# Patient Record
Sex: Female | Born: 1945 | Race: Asian | Hispanic: No | Marital: Married | Smoking: Never smoker
Health system: Southern US, Community
[De-identification: ages and names within clinical notes are randomized; demographics above are authoritative.]

## PROBLEM LIST (undated history)

## (undated) DIAGNOSIS — I10 Essential (primary) hypertension: Secondary | ICD-10-CM

## (undated) DIAGNOSIS — H269 Unspecified cataract: Secondary | ICD-10-CM

## (undated) DIAGNOSIS — M542 Cervicalgia: Secondary | ICD-10-CM

## (undated) DIAGNOSIS — E785 Hyperlipidemia, unspecified: Secondary | ICD-10-CM

## (undated) DIAGNOSIS — K029 Dental caries, unspecified: Secondary | ICD-10-CM

## (undated) DIAGNOSIS — E042 Nontoxic multinodular goiter: Secondary | ICD-10-CM

## (undated) HISTORY — DX: Unspecified cataract: H26.9

## (undated) HISTORY — DX: Dental caries, unspecified: K02.9

## (undated) HISTORY — DX: Cervicalgia: M54.2

## (undated) HISTORY — DX: Hyperlipidemia, unspecified: E78.5

## (undated) HISTORY — DX: Nontoxic multinodular goiter: E04.2

## (undated) HISTORY — PX: NO PAST SURGERIES: SHX2092

---

## 2013-02-02 ENCOUNTER — Emergency Department (INDEPENDENT_AMBULATORY_CARE_PROVIDER_SITE_OTHER)
Admission: EM | Admit: 2013-02-02 | Discharge: 2013-02-02 | Disposition: A | Discharge status: Home / Self Care | Payer: Self-pay | Source: Home / Self Care | Attending: Family Medicine | Admitting: Family Medicine

## 2013-02-02 ENCOUNTER — Encounter (HOSPITAL_COMMUNITY): Payer: Self-pay

## 2013-02-02 DIAGNOSIS — I10 Essential (primary) hypertension: Secondary | ICD-10-CM

## 2013-02-02 HISTORY — DX: Essential (primary) hypertension: I10

## 2013-02-02 MED ORDER — AMLODIPINE BESYLATE 5 MG PO TABS: 5.0000 mg | ORAL_TABLET | Freq: Every day | ORAL | Status: DC

## 2013-02-02 MED ORDER — HYDROCHLOROTHIAZIDE 25 MG PO TABS: 25.0000 mg | ORAL_TABLET | Freq: Every day | ORAL | Status: DC

## 2013-02-02 NOTE — ED Provider Notes (Signed)
History     CSN: [id number]  Arrival date & time 02/02/13  1153   First MD Initiated Contact with Patient 02/02/13 1315      Chief Complaint  Patient presents with  . Hypertension    (Consider location/radiation/quality/duration/timing/severity/associated sxs/prior treatment) Patient is a 67 y.o. female presenting with hypertension. The history is provided by the patient. The history is limited by a language barrier. A language interpreter was used.  Hypertension This is a new problem. Episode onset: out of bp meds. Pertinent negatives include no chest pain and no shortness of breath.    Past Medical History  Diagnosis Date  . Hypertension     History reviewed. No pertinent past surgical history.  History reviewed. No pertinent family history.  History  Substance Use Topics  . Smoking status: Not on file  . Smokeless tobacco: Not on file  . Alcohol Use: No    OB History   Grav Para Term Preterm Abortions TAB SAB Ect Mult Living                  Review of Systems  Constitutional: Negative.   Respiratory: Negative for cough, chest tightness and shortness of breath.   Cardiovascular: Negative for chest pain.    Allergies  Review of patient's allergies indicates not on file.  Home Medications   Current Outpatient Rx  Name  Route  Sig  Dispense  Refill  . amLODipine (NORVASC) 5 MG tablet   Oral   Take 1 tablet (5 mg total) by mouth daily.   30 tablet   1   . hydrochlorothiazide (HYDRODIURIL) 25 MG tablet   Oral   Take 1 tablet (25 mg total) by mouth daily.   30 tablet   1     BP 135/78  Pulse 64  Temp(Src) 98 F (36.7 C) (Oral)  Resp 18  SpO2 99%  Physical Exam  Nursing note and vitals reviewed. Constitutional: She is oriented to person, place, and time. She appears well-developed and well-nourished.  HENT:  Head: Normocephalic.  Neck: Normal range of motion. Neck supple.  Cardiovascular: Normal rate, regular rhythm, normal heart sounds and  intact distal pulses.   Pulmonary/Chest: Effort normal and breath sounds normal.  Musculoskeletal: She exhibits no edema.  Neurological: She is alert and oriented to person, place, and time.  Skin: Skin is warm and dry.    ED Course  Procedures (including critical care time)  Labs Reviewed - No data to display No results found.   1. Hypertension       MDM          Linna Hoff, MD 02/02/13 [phone]

## 2013-02-02 NOTE — ED Notes (Signed)
Needs medication refill for her BP; does not speak  [place]

## 2013-02-14 ENCOUNTER — Encounter (HOSPITAL_COMMUNITY): Payer: Self-pay

## 2013-02-14 ENCOUNTER — Emergency Department (HOSPITAL_COMMUNITY)
Admission: EM | Admit: 2013-02-14 | Discharge: 2013-02-14 | Disposition: A | Discharge status: Home / Self Care | Payer: Medicaid Other | Source: Home / Self Care

## 2013-02-14 DIAGNOSIS — I10 Essential (primary) hypertension: Secondary | ICD-10-CM

## 2013-02-14 NOTE — ED Provider Notes (Signed)
History     CSN: [id number]  Arrival date & time 02/14/13  1506   None     Chief Complaint  Patient presents with  . Follow-up    67 year old female who presents to the clinic for followup of her hypertension. Systolic blood pressure in the 120s. Language interpreter was used. The patient and her husband speak [facility] , and were able to provide very limited history despite use of an interpreter. The patient was found to have extremely poor dental hygiene related to chewing tobacco. She states that she has been compliant with her medications and does not need refills today   HPI  Past Medical History  Diagnosis Date  . Hypertension     History reviewed. No pertinent past surgical history.  No family history on file.  History  Substance Use Topics  . Smoking status: Not on file  . Smokeless tobacco: Not on file  . Alcohol Use: No    OB History   Grav Para Term Preterm Abortions TAB SAB Ect Mult Living                  Review of Systems As above Allergies  Review of patient's allergies indicates not on file.  Home Medications   Current Outpatient Rx  Name  Route  Sig  Dispense  Refill  . amLODipine (NORVASC) 5 MG tablet   Oral   Take 1 tablet (5 mg total) by mouth daily.   30 tablet   1   . hydrochlorothiazide (HYDRODIURIL) 25 MG tablet   Oral   Take 1 tablet (25 mg total) by mouth daily.   30 tablet   1     BP 129/75  Pulse 77  Temp(Src) 97.5 F (36.4 C) (Oral)  Resp 17  SpO2 99%  Physical Exam Constitutional: She is oriented to person, place, and time. She appears well-developed and well-nourished.  HENT:  Head: Normocephalic.  Neck: Normal range of motion. Neck supple.  Cardiovascular: Normal rate, regular rhythm, normal heart sounds and intact distal pulses.  Pulmonary/Chest: Effort normal and breath sounds normal.  Musculoskeletal: She exhibits no edema.  Neurological: She is alert and oriented to person, place, and time.  Skin: Skin is  warm and dry.   ED Course  Procedures (including critical care time)  Labs Reviewed - No data to display No results found.   No diagnosis found.    MDM  Hypertension continue Norvasc and HCTZ  Followup in 3 months        Richarda Overlie, MD 02/14/13 1551

## 2013-02-14 NOTE — ED Notes (Signed)
Follow up- history of hypertension 

## 2013-03-30 ENCOUNTER — Other Ambulatory Visit: Payer: Self-pay | Admitting: Infectious Diseases

## 2013-03-30 ENCOUNTER — Ambulatory Visit
Admission: RE | Admit: 2013-03-30 | Discharge: 2013-03-30 | Disposition: A | Discharge status: Home / Self Care | Payer: No Typology Code available for payment source | Source: Ambulatory Visit | Attending: Infectious Diseases | Admitting: Infectious Diseases

## 2013-03-30 DIAGNOSIS — A150 Tuberculosis of lung: Secondary | ICD-10-CM

## 2016-01-15 ENCOUNTER — Ambulatory Visit (INDEPENDENT_AMBULATORY_CARE_PROVIDER_SITE_OTHER): Payer: Medicaid Other | Admitting: Internal Medicine

## 2016-01-15 ENCOUNTER — Encounter: Payer: Self-pay | Admitting: Internal Medicine

## 2016-01-15 VITALS — BP 150/92 | HR 67 | Resp 16 | Ht 59.0 in | Wt 101.5 lb

## 2016-01-15 DIAGNOSIS — R5383 Other fatigue: Secondary | ICD-10-CM

## 2016-01-15 DIAGNOSIS — I10 Essential (primary) hypertension: Secondary | ICD-10-CM | POA: Diagnosis not present

## 2016-01-15 DIAGNOSIS — H18413 Arcus senilis, bilateral: Secondary | ICD-10-CM

## 2016-01-15 DIAGNOSIS — E041 Nontoxic single thyroid nodule: Secondary | ICD-10-CM | POA: Diagnosis not present

## 2016-01-15 DIAGNOSIS — M5412 Radiculopathy, cervical region: Secondary | ICD-10-CM

## 2016-01-15 MED ORDER — AMLODIPINE BESYLATE 5 MG PO TABS: 5.0000 mg | ORAL_TABLET | Freq: Every day | ORAL | Status: DC

## 2016-01-15 NOTE — Progress Notes (Signed)
Subjective:    Patient ID: Julia Maldonado, female    DOB: October 28, 1945, 70 y.o.   MRN: [id number]  HPI   1.  Chronic neck and upper back pain:  Also has burning in same area.  Some burning down bilateral arms to hands.  Symptoms in arms are similar.   Symptoms really seem to bother her around 3 p.m. Every day.  States she doesn't really do anything all day.  Sits or gets up and walks around the house. Has not taken any medication for this.  2.  Husband, [name], and patient moved from [place] to [place] about 2 months ago, and now away from their community.  Her pains did not start until then.  She is bored and has nothing to do.  They both like to garden, but do not have any place to do so.   3.  Essential Hypertension:  Was treated for hypertension in past, but patient cannot recall ever being treated.  She uses [facility] on [address].  Meds:  HCTZ 25 mg daily (has not used in some time)  No Known Allergies   Past Medical History  Diagnosis Date  . Hypertension ?2014    No past surgical history on file.   No family history on file.   Social History   Social History  . Marital Status: Married    Spouse Name: [name]  . Number of Children: 7  . Years of Education: 0   Occupational History  . None    Social History Main Topics  . Smoking status: Never Smoker   . Smokeless tobacco: Current User    Types: Chew     Comment: Limited as not interested.  Discussed oral cancer   . Alcohol Use: 0.0 oz/week    0 Standard drinks or equivalent per week     Comment: Alcoholic drink twice yearly  . Drug Use: No  . Sexual Activity: Yes   Other Topics Concern  . Not on file   Social History Narrative   Originally from [place]   Was in [place] for many years   Came in 2014 to [place].   January 2016 moved from [place] to [place].   Lives with husband and daughter's family in [place].         Review of Systems     Objective:   Physical Exam  NAD HEENT:  PERRL, EOMI, bilateral arcus senilis, discs sharp, TMs pearly gray.  Teeth:  Missing upper teeth on right, missing lower teeth on left.  Teeth are ground down and stained a dark black.  Difficult to tell if has signficant cavities with discoloration.  Face collapsed on right when closes mouth due to loss of teeth. Neck:  Supple, No adenopathy, tender over cervical paraspinous musculature to nuchal ridge and traps bilaterally.   Large left hard thyroid nodule, entire gland nodular and enlarged. Chest:  CTA CV:  RRR with normal S1 and S2, No S3, S4, or murmur.  No carotid bruits, carotid, radial and DP pulses normodynamic and equal.  No LE edema.   Abd:  S, NT, No HSM or mass, +BS  Neuro:  A & O x3, CN II-XII intact, DTRs 2+/4 throughout, Motor, 5/5 throughout.  Gait normal       Assessment & Plan:  1.  Essential Hypertension:  Start Amlodipine 5 mg daily Follow up with nurse visit for bp check in 2 weeks., with me in 1 month.  Check CMP.  2.  Possible cervical radiculopathy:  Cspine films.  Neck stretches given.  3.  Nodular goiter with a very large nodule in left lobe:  TSH, thyroid ultrasound.  4.  Heath screening:  FLP with arcus senilis  5.  Fatigue:  CMP, CBC, TSH  6.  Social:  Referral to [facility] to get in with gardening behind clinic and in community  7.  Dental:  Will look into Medicaid Dentists.

## 2016-01-16 LAB — CBC WITH DIFFERENTIAL/PLATELET
BASOS ABS: 0 10*3/uL (ref 0.0–0.2)
Basos: 0 %
EOS (ABSOLUTE): 0.2 10*3/uL (ref 0.0–0.4)
EOS: 3 %
HEMATOCRIT: 40.3 % (ref 34.0–46.6)
HEMOGLOBIN: 13.5 g/dL (ref 11.1–15.9)
Immature Grans (Abs): 0 10*3/uL (ref 0.0–0.1)
Immature Granulocytes: 0 %
LYMPHS ABS: 2.2 10*3/uL (ref 0.7–3.1)
Lymphs: 31 %
MCH: 30.3 pg (ref 26.6–33.0)
MCHC: 33.5 g/dL (ref 31.5–35.7)
MCV: 90 fL (ref 79–97)
MONOCYTES: 8 %
Monocytes Absolute: 0.6 10*3/uL (ref 0.1–0.9)
NEUTROS ABS: 4.1 10*3/uL (ref 1.4–7.0)
Neutrophils: 58 %
Platelets: 195 10*3/uL (ref 150–379)
RBC: 4.46 x10E6/uL (ref 3.77–5.28)
RDW: 13.6 % (ref 12.3–15.4)
WBC: 7.2 10*3/uL (ref 3.4–10.8)

## 2016-01-16 LAB — COMPREHENSIVE METABOLIC PANEL
ALBUMIN: 4.6 g/dL (ref 3.5–4.8)
ALT: 24 IU/L (ref 0–32)
AST: 34 IU/L (ref 0–40)
Albumin/Globulin Ratio: 1.3 (ref 1.2–2.2)
Alkaline Phosphatase: 108 IU/L (ref 39–117)
BILIRUBIN TOTAL: 0.5 mg/dL (ref 0.0–1.2)
BUN / CREAT RATIO: 17 (ref 11–26)
BUN: 10 mg/dL (ref 8–27)
CHLORIDE: 100 mmol/L (ref 96–106)
CO2: 23 mmol/L (ref 18–29)
Calcium: 9.5 mg/dL (ref 8.7–10.3)
Creatinine, Ser: 0.59 mg/dL (ref 0.57–1.00)
GFR calc non Af Amer: 93 mL/min/{1.73_m2} (ref >59)
GFR, EST AFRICAN AMERICAN: 107 mL/min/{1.73_m2} (ref >59)
GLOBULIN, TOTAL: 3.5 g/dL (ref 1.5–4.5)
GLUCOSE: 89 mg/dL (ref 65–99)
Potassium: 4.5 mmol/L (ref 3.5–5.2)
Sodium: 140 mmol/L (ref 134–144)
TOTAL PROTEIN: 8.1 g/dL (ref 6.0–8.5)

## 2016-01-16 LAB — TSH: TSH: 0.872 u[IU]/mL (ref 0.450–4.500)

## 2016-01-16 LAB — LIPID PANEL W/O CHOL/HDL RATIO
Cholesterol, Total: 262 mg/dL — ABNORMAL HIGH (ref 100–199)
HDL: 50 mg/dL (ref >39)
LDL CALC: 181 mg/dL — AB (ref 0–99)
Triglycerides: 156 mg/dL — ABNORMAL HIGH (ref 0–149)
VLDL CHOLESTEROL CAL: 31 mg/dL (ref 5–40)

## 2016-01-23 ENCOUNTER — Encounter: Payer: Self-pay | Admitting: Internal Medicine

## 2016-01-23 DIAGNOSIS — E785 Hyperlipidemia, unspecified: Secondary | ICD-10-CM | POA: Insufficient documentation

## 2016-01-27 ENCOUNTER — Ambulatory Visit
Admission: RE | Admit: 2016-01-27 | Discharge: 2016-01-27 | Disposition: A | Discharge status: Home / Self Care | Payer: Medicaid Other | Source: Ambulatory Visit | Attending: Internal Medicine | Admitting: Internal Medicine

## 2016-01-27 DIAGNOSIS — E041 Nontoxic single thyroid nodule: Secondary | ICD-10-CM

## 2016-02-12 ENCOUNTER — Encounter: Payer: Self-pay | Admitting: Internal Medicine

## 2016-02-12 ENCOUNTER — Ambulatory Visit: Payer: Medicaid Other | Admitting: Internal Medicine

## 2016-02-12 ENCOUNTER — Ambulatory Visit (INDEPENDENT_AMBULATORY_CARE_PROVIDER_SITE_OTHER): Payer: Medicaid Other | Admitting: Internal Medicine

## 2016-02-12 VITALS — BP 118/74 | HR 80 | Resp 18 | Ht 59.0 in | Wt 101.5 lb

## 2016-02-12 DIAGNOSIS — E785 Hyperlipidemia, unspecified: Secondary | ICD-10-CM | POA: Diagnosis not present

## 2016-02-12 DIAGNOSIS — I10 Essential (primary) hypertension: Secondary | ICD-10-CM | POA: Diagnosis not present

## 2016-02-12 DIAGNOSIS — E042 Nontoxic multinodular goiter: Secondary | ICD-10-CM | POA: Insufficient documentation

## 2016-02-12 DIAGNOSIS — M542 Cervicalgia: Secondary | ICD-10-CM

## 2016-02-12 DIAGNOSIS — K029 Dental caries, unspecified: Secondary | ICD-10-CM | POA: Insufficient documentation

## 2016-02-12 HISTORY — DX: Cervicalgia: M54.2

## 2016-02-12 HISTORY — DX: Nontoxic multinodular goiter: E04.2

## 2016-02-12 NOTE — Patient Instructions (Signed)
We will be sending you to Interventional Radiology for biopsy of your thyroid nodules (3 of them) I will call about the neck xray. Your blood pressure is good.

## 2016-02-12 NOTE — Progress Notes (Signed)
   Subjective:    Patient ID: Julia Maldonado, female    DOB: [birth year], 70 y.o.   MRN: [id number]  HPI  Julia Maldonado interpreting  Here for lab and thyroid ultrasound follow up:  1.  Normal CBC, CMP, TSH discussed  2.  Multinodular goiter with 3 nodules recommend FNA for further evaluation, particularly the 4 cm left lobe nodule.  Pt. Understands need to evaluate.  Denies difficulty with swallowing or difficulties with breathing, upper respiratory noise.  3.  Hyperlipidemia  Results for Maldonado, Julia (MRN [id number]) as of 02/12/2016 19:38  Ref. Range 01/15/2016 17:48  Cholesterol, Total Latest Ref Range: 100-199 mg/dL 262 (H)  Triglycerides Latest Ref Range: 0-149 mg/dL 156 (H)  HDL Cholesterol Latest Ref Range: >39 mg/dL 50  LDL (calc) Latest Ref Range: 0-99 mg/dL 181 (H)  VLDL Cholesterol Cal Latest Ref Range: 5-40 mg/dL 31  Discussed diet.  Eats much rice, veggies--lettuce, eggplant beans, nonfatty beef, pork, chicken, fish sometimes.   Has some difficulty eating veggies due to dental issues.  Does not want any teeth pulled. Drinks water, coffee, tea, no soda or juice. 1-2 meals daily. Not much in way of physical activity. Has not been gardening with husband as someone needs to watch their grandchildren and husband apparently does not do childcare.  4.  Essential Hypertension:  Taking Amlodipine 5 mg regularly.  No problems  5.  Neck pain with possible cervical radiculopathy:  This did not start bothering her until the move away from their community in [place].  For some reason, did not get Cspine film when went in for ultrasound of thyroid.  [facility]   Current outpatient prescriptions:  .  amLODipine (NORVASC) 5 MG tablet, Take 1 tablet (5 mg total) by mouth daily., Disp: 30 tablet, Rfl: 11   No Known Allergies    Review of Systems     Objective:   Physical Exam HEENT:  Blackened teeth without change Neck:  Nodular goiter Chest:  CTA CV:  RRR with normal  radial and DP pulses  Extrems:  No edema.       Assessment & Plan:  1.  Essential Hypertension:  Controlled with Amlodipine.  Discussed will likely need to take medication life long and to refill each month before out of pills in current bottle  2.  Hyperlipidemia:  Recommended switching off with husband with gardening.  Doubt that will happen.  To try and increase physical activity.  To increase veggies.  REcheck FLP in 3 months  3.  Multinodular Goiter:  Referral to interventional radiology for U/S guided FNA x 3 nodules  4.  Neck pain:  Check into Cspine and why not done.  5.  Dental:  Encouraged to make appt. With [place] speaking dentist on [address].  If she does not want teeth pulled--can say no.  Or may be able to get a partial, but likely not with no anchor teeth on both sides of mouth.

## 2016-02-19 ENCOUNTER — Ambulatory Visit: Payer: Medicaid Other | Admitting: Internal Medicine

## 2016-04-14 ENCOUNTER — Ambulatory Visit
Admission: RE | Admit: 2016-04-14 | Discharge: 2016-04-14 | Disposition: A | Discharge status: Home / Self Care | Payer: Medicaid Other | Source: Ambulatory Visit | Attending: Internal Medicine | Admitting: Internal Medicine

## 2016-04-14 DIAGNOSIS — M5412 Radiculopathy, cervical region: Secondary | ICD-10-CM

## 2016-04-15 ENCOUNTER — Encounter: Payer: Self-pay | Admitting: Family Medicine

## 2016-04-15 ENCOUNTER — Ambulatory Visit (INDEPENDENT_AMBULATORY_CARE_PROVIDER_SITE_OTHER): Payer: Medicaid Other | Admitting: Family Medicine

## 2016-04-15 VITALS — BP 122/60 | HR 74 | Resp 16 | Ht 59.0 in | Wt 103.0 lb

## 2016-04-15 DIAGNOSIS — E785 Hyperlipidemia, unspecified: Secondary | ICD-10-CM

## 2016-04-15 DIAGNOSIS — I10 Essential (primary) hypertension: Secondary | ICD-10-CM

## 2016-04-15 MED ORDER — ATORVASTATIN CALCIUM 10 MG PO TABS: 10.0000 mg | ORAL_TABLET | Freq: Every day | ORAL | Status: DC

## 2016-05-05 ENCOUNTER — Other Ambulatory Visit: Payer: Self-pay | Admitting: Internal Medicine

## 2016-05-05 DIAGNOSIS — E041 Nontoxic single thyroid nodule: Secondary | ICD-10-CM

## 2016-05-21 ENCOUNTER — Other Ambulatory Visit (HOSPITAL_COMMUNITY)
Admission: RE | Admit: 2016-05-21 | Discharge: 2016-05-21 | Disposition: A | Discharge status: Home / Self Care | Payer: Medicaid Other | Source: Ambulatory Visit | Attending: Physician Assistant | Admitting: Physician Assistant

## 2016-05-21 ENCOUNTER — Ambulatory Visit
Admission: RE | Admit: 2016-05-21 | Discharge: 2016-05-21 | Disposition: A | Discharge status: Home / Self Care | Payer: Medicaid Other | Source: Ambulatory Visit | Attending: Internal Medicine | Admitting: Internal Medicine

## 2016-05-21 DIAGNOSIS — E041 Nontoxic single thyroid nodule: Secondary | ICD-10-CM

## 2016-05-21 NOTE — Procedures (Signed)
  Using direct ultrasound guidance, 4 passes were made using 25 g needles into each of the nodules within the left lobe, right lobe, and the isthmus of the thyroid.   Ultrasound was used to confirm needle placements on all occasions.   Specimens were sent to Pathology for analysis.   Jacquetta Polhamus S Daemon Dowty PA-C 05/21/2016 2:57 PM

## 2016-06-05 ENCOUNTER — Encounter: Payer: Self-pay | Admitting: Family Medicine

## 2016-06-05 NOTE — Progress Notes (Signed)
   Subjective:    Patient ID: Julia Maldonado, female    DOB: October 13, 1946, 70 y.o.   MRN: [id number]  HPI  Transcription of written record by Dr. Carolann Littler from 04/15/2016. See "media" tab for scanned in written record.  Neck Pain:  Had C-spine xray yesterday:  Mild degenerative changes, esp.  Right C3, C4/No fx or acute changes.  Denies any pain, numbness, or weakness.  Goiter:  Large Calcified mass:  Thyroid 4.4 cm  X 3 cm on thyroid ultrasound rec'd.  Already with referral for FNA of thyroid mass.  U/S done long time ago in [place].  Nl. TSH.  Hypertension:  Feels good on BP medicine.  LDL=181  Current Meds  Medication Sig  . amLODipine (NORVASC) 5 MG tablet Take 1 tablet (5 mg total) by mouth daily.    No Known Allergies          Review of Systems     Objective:   Physical Exam Skin:  Nl Neck:  Full ROM/ No pain, NT Lungs:  CTA CV:  RRR Extr:  - CCE       Assessment & Plan:  1.  HTN:  Controlled  2.  High cholesterol:  Start Atorvastatin 10 mg PO qd #30 5 RF Recheck fasting lipid 3-6 mo  F/U with Dr. Amil Amen in 3 months  3.  Neck Arthritis:  Improved:  Take Tylenol as needed.

## 2016-06-09 ENCOUNTER — Encounter: Payer: Self-pay | Admitting: Internal Medicine

## 2016-06-09 ENCOUNTER — Ambulatory Visit (INDEPENDENT_AMBULATORY_CARE_PROVIDER_SITE_OTHER): Payer: Medicaid Other | Admitting: Internal Medicine

## 2016-06-09 VITALS — BP 100/60 | HR 70 | Resp 18 | Ht 59.0 in | Wt 102.0 lb

## 2016-06-09 DIAGNOSIS — E042 Nontoxic multinodular goiter: Secondary | ICD-10-CM

## 2016-06-09 MED ORDER — LEVOTHYROXINE SODIUM 25 MCG PO TABS: 25.0000 ug | ORAL_TABLET | Freq: Every day | ORAL | 11 refills | Status: DC

## 2016-06-09 NOTE — Progress Notes (Signed)
   Subjective:    Patient ID: Julia Maldonado, female    DOB: 07-04-1946, 70 y.o.   MRN: [id number]  HPI   Here to discuss FNA results of 3 separate thyroid nodules:   Right:  Benign follicular nodule Left:  Scant follicular epithelium Isthmus:  Scant follicular epithelium  Discussed appears benign, though not diagnostic with 2 of the nodules.  Not having any difficulties with breathing or swallowing.  Would be interested in seeing if taking thyroid hormone could possibly decrease size of nodules.   Current Meds  Medication Sig  . amLODipine (NORVASC) 5 MG tablet Take 1 tablet (5 mg total) by mouth daily.  Marland Kitchen atorvastatin (LIPITOR) 10 MG tablet Take 1 tablet (10 mg total) by mouth daily.    No Known Allergies    Review of Systems     Objective:   Physical Exam  NAD Neck:  Supple, No adenopathy Significantly enlarged, nodular thyroid, particularly nodule on left.          Assessment & Plan:  1.  Multinodular Goiter:  Levothryroxine 25 mcg daily for suppression of nodule growth.  To call if signs of over replacement--discussed what those are with interpretation. Repeat TSH in 4 months with fasting labs

## 2016-06-23 ENCOUNTER — Encounter: Payer: Self-pay | Admitting: *Deleted

## 2016-06-23 DIAGNOSIS — Z139 Encounter for screening, unspecified: Secondary | ICD-10-CM

## 2016-06-23 LAB — GLUCOSE, POCT (MANUAL RESULT ENTRY): POC Glucose: 77 mg/dl (ref 70–99)

## 2016-06-23 NOTE — Congregational Nurse Program (Signed)
Congregational Nurse Program Note  Date of Encounter: 06/23/2016  Past Medical History: Past Medical History:  Diagnosis Date  . Hypertension ?2014  . Multinodular goiter 02/12/2016  . Neck pain 02/12/2016    Encounter Details:     CNP Questionnaire - 06/23/16 1230      Patient Demographics   Is this a new or existing patient? New   Patient is considered a/an [place]   Race Other     Patient Assistance   Location of Patient [place]   Patient's financial/insurance status Medicaid   Uninsured Patient No   Patient referred to apply for the following financial assistance Not Applicable   Food insecurities addressed Not Applicable   Transportation assistance No   Assistance securing medications No   Educational health offerings Not Applicable     Encounter Details   Primary purpose of visit Other   Was an Emergency Department visit averted? Not Applicable   Does patient have a medical provider? Yes   Patient referred to Not Applicable   Was a mental health screening completed? (GAINS tool) No   Does patient have dental issues? No   Does patient have vision issues? No   Does your patient have an abnormal blood pressure today? No   Since previous encounter, have you referred patient for abnormal blood pressure that resulted in a new diagnosis or medication change? No   Does your patient have an abnormal blood glucose today? No   Since previous encounter, have you referred patient for abnormal blood glucose that resulted in a new diagnosis or medication change? No   Was there a life-saving intervention made? No         Amb Nursing Assessment - 06/09/16 1626      Pre-visit preparation   Pre-visit preparation completed Yes     Pain Assessment   Pain Assessment No/denies pain   Pain Score 0-No pain     Nutrition Screen   BMI - recorded 20.6   Nutritional Status BMI of 19-24  Normal   Nutritional Risks None   Diabetes No     Functional Status   Activities of Daily Living Independent   Ambulation Independent   Medication Administration Independent   Home Management Independent     Risk/Barriers  Assessment   Barriers to Care Management & Learning Language   Psychosocial Barriers Uninsured/under-insured     Abuse/Neglect Assessment   Do you feel unsafe in your current relationship? No   Do you feel physically threatened by others? No   Anyone hurting you at home, work, or school? No   Unable to ask? No     Patient Literacy   How often do you need to have someone help you when you read instructions, pamphlets, or other written materials from your doctor or pharmacy? 5 - Always     Language Assistant   Interpreter Needed? Yes   Interpreter Name Se[place] Mo   Patient Declined Interpreter  No   Patient signed [place] waiver No     Client came to center to have nurse to call about her bill from hospital and what she needed to do. Went over bill and explained through  Interpreter present about her financial responsibility. She also wanted her BP checked and CBG which were normal. [employer] RN BSN CN [phone]

## 2016-06-24 ENCOUNTER — Ambulatory Visit: Payer: Medicaid Other | Admitting: Internal Medicine

## 2016-07-06 ENCOUNTER — Other Ambulatory Visit (INDEPENDENT_AMBULATORY_CARE_PROVIDER_SITE_OTHER): Payer: Medicaid Other

## 2016-07-06 DIAGNOSIS — Z23 Encounter for immunization: Secondary | ICD-10-CM

## 2016-07-06 DIAGNOSIS — E785 Hyperlipidemia, unspecified: Secondary | ICD-10-CM | POA: Diagnosis not present

## 2016-07-06 DIAGNOSIS — I10 Essential (primary) hypertension: Secondary | ICD-10-CM

## 2016-07-06 DIAGNOSIS — E041 Nontoxic single thyroid nodule: Secondary | ICD-10-CM

## 2016-07-07 LAB — COMPREHENSIVE METABOLIC PANEL
ALBUMIN: 4.1 g/dL (ref 3.5–4.8)
ALK PHOS: 103 IU/L (ref 39–117)
ALT: 10 IU/L (ref 0–32)
AST: 14 IU/L (ref 0–40)
Albumin/Globulin Ratio: 1.3 (ref 1.2–2.2)
BUN / CREAT RATIO: 18 (ref 12–28)
BUN: 9 mg/dL (ref 8–27)
Bilirubin Total: 0.3 mg/dL (ref 0.0–1.2)
CO2: 22 mmol/L (ref 18–29)
CREATININE: 0.51 mg/dL — AB (ref 0.57–1.00)
Calcium: 9 mg/dL (ref 8.7–10.3)
Chloride: 106 mmol/L (ref 96–106)
GFR calc Af Amer: 113 mL/min/{1.73_m2} (ref >59)
GFR calc non Af Amer: 98 mL/min/{1.73_m2} (ref >59)
GLUCOSE: 88 mg/dL (ref 65–99)
Globulin, Total: 3.1 g/dL (ref 1.5–4.5)
Potassium: 3.8 mmol/L (ref 3.5–5.2)
Sodium: 144 mmol/L (ref 134–144)
Total Protein: 7.2 g/dL (ref 6.0–8.5)

## 2016-07-07 LAB — CBC WITH DIFFERENTIAL/PLATELET
Basophils Absolute: 0 10*3/uL (ref 0.0–0.2)
Basos: 0 %
EOS (ABSOLUTE): 0.3 10*3/uL (ref 0.0–0.4)
EOS: 5 %
HEMATOCRIT: 36.3 % (ref 34.0–46.6)
HEMOGLOBIN: 12 g/dL (ref 11.1–15.9)
IMMATURE GRANS (ABS): 0 10*3/uL (ref 0.0–0.1)
IMMATURE GRANULOCYTES: 0 %
LYMPHS ABS: 1.5 10*3/uL (ref 0.7–3.1)
LYMPHS: 28 %
MCH: 30.2 pg (ref 26.6–33.0)
MCHC: 33.1 g/dL (ref 31.5–35.7)
MCV: 91 fL (ref 79–97)
MONOCYTES: 12 %
Monocytes Absolute: 0.6 10*3/uL (ref 0.1–0.9)
NEUTROS PCT: 55 %
Neutrophils Absolute: 2.9 10*3/uL (ref 1.4–7.0)
PLATELETS: 176 10*3/uL (ref 150–379)
RBC: 3.97 x10E6/uL (ref 3.77–5.28)
RDW: 13.4 % (ref 12.3–15.4)
WBC: 5.3 10*3/uL (ref 3.4–10.8)

## 2016-07-07 LAB — LIPID PANEL W/O CHOL/HDL RATIO
Cholesterol, Total: 149 mg/dL (ref 100–199)
HDL: 43 mg/dL (ref >39)
LDL Calculated: 83 mg/dL (ref 0–99)
Triglycerides: 114 mg/dL (ref 0–149)
VLDL CHOLESTEROL CAL: 23 mg/dL (ref 5–40)

## 2016-07-07 LAB — TSH: TSH: 0.339 u[IU]/mL — ABNORMAL LOW (ref 0.450–4.500)

## 2016-07-07 LAB — HEPATIC FUNCTION PANEL: BILIRUBIN, DIRECT: 0.09 mg/dL (ref 0.00–0.40)

## 2016-07-15 ENCOUNTER — Ambulatory Visit: Payer: Medicaid Other | Admitting: Internal Medicine

## 2016-10-07 ENCOUNTER — Ambulatory Visit (INDEPENDENT_AMBULATORY_CARE_PROVIDER_SITE_OTHER): Payer: Medicaid Other | Admitting: Internal Medicine

## 2016-10-07 ENCOUNTER — Encounter: Payer: Self-pay | Admitting: Internal Medicine

## 2016-10-07 DIAGNOSIS — I10 Essential (primary) hypertension: Secondary | ICD-10-CM

## 2016-10-07 MED ORDER — AMLODIPINE BESYLATE 5 MG PO TABS: ORAL_TABLET | ORAL | 11 refills | Status: DC

## 2016-10-07 NOTE — Progress Notes (Signed)
   Subjective:    Patient ID: Julia Maldonado, female    DOB: 1946/09/24, 70 y.o.   MRN: [id number]  HPI   1.  Multinodular goiter:  Feels well on 25 mcg of Levothyroxine.  States she has been taking daily.   Had some tremulousness initially, but that has resolved.  Has good appetite.  No diarrhea.  Skin is fine.  May be losing more hair. Feels her thyroid feels softer than before.  Has noted her swallowing is easier than prior to starting the medication  2.  Essential Hypertension:  Taking amlodipine 5 mg daily.  No dizziness, despite low normal bp currently.    Current Meds  Medication Sig  . amLODipine (NORVASC) 5 MG tablet Take 1 tablet (5 mg total) by mouth daily.  Marland Kitchen atorvastatin (LIPITOR) 10 MG tablet Take 1 tablet (10 mg total) by mouth daily.  Marland Kitchen levothyroxine (SYNTHROID, LEVOTHROID) 25 MCG tablet Take 1 tablet (25 mcg total) by mouth daily before breakfast.    No Known Allergies  Review of Systems     Objective:   Physical Exam NAD Neck:  Nodular goiter, feels a bit smaller. Lungs:  CTA CV:  RRR without murmur or rub, radial pulses normal and equal Skin:  Dry Neuro:  No tremulousness. DTRs 2+/4 at ankles        Assessment & Plan:  1.  Multinodular Goiter:  Continue Levothyroxine. 25 mcg.  2.  Essential Hypertension:  At end of visit, patient states she actually is not eating well as when she eats, she gets shaky and weak.  Will have her cut her Amlodipine to 2.5 mg daily and return for BP and pulse check in 1 week to see if this helps.  3.  Hyperlipidemia:  On Atorvastatin with good results in September.  Is to have hepatic profile repeat in March.

## 2016-10-14 ENCOUNTER — Ambulatory Visit: Payer: Medicaid Other

## 2016-10-14 ENCOUNTER — Other Ambulatory Visit: Payer: Self-pay | Admitting: Internal Medicine

## 2016-10-14 VITALS — BP 122/72 | HR 66

## 2016-10-14 DIAGNOSIS — I10 Essential (primary) hypertension: Secondary | ICD-10-CM

## 2016-10-14 DIAGNOSIS — E785 Hyperlipidemia, unspecified: Secondary | ICD-10-CM

## 2016-10-15 ENCOUNTER — Other Ambulatory Visit: Payer: Self-pay

## 2016-10-15 DIAGNOSIS — E782 Mixed hyperlipidemia: Secondary | ICD-10-CM

## 2016-10-15 MED ORDER — ATORVASTATIN CALCIUM 10 MG PO TABS: 10.0000 mg | ORAL_TABLET | Freq: Every day | ORAL | 11 refills | Status: DC

## 2016-10-15 NOTE — Telephone Encounter (Signed)
Rx faxed to Pharmacy.

## 2017-01-06 ENCOUNTER — Other Ambulatory Visit (INDEPENDENT_AMBULATORY_CARE_PROVIDER_SITE_OTHER): Payer: Medicaid Other

## 2017-01-06 DIAGNOSIS — Z79899 Other long term (current) drug therapy: Secondary | ICD-10-CM

## 2017-01-07 LAB — HEPATIC FUNCTION PANEL
ALT: 10 IU/L (ref 0–32)
AST: 19 IU/L (ref 0–40)
Albumin: 4.5 g/dL (ref 3.5–4.8)
Alkaline Phosphatase: 93 IU/L (ref 39–117)
BILIRUBIN TOTAL: 0.5 mg/dL (ref 0.0–1.2)
Bilirubin, Direct: 0.11 mg/dL (ref 0.00–0.40)
TOTAL PROTEIN: 7.9 g/dL (ref 6.0–8.5)

## 2017-04-07 ENCOUNTER — Ambulatory Visit (INDEPENDENT_AMBULATORY_CARE_PROVIDER_SITE_OTHER): Payer: Medicaid Other | Admitting: Internal Medicine

## 2017-04-07 ENCOUNTER — Encounter: Payer: Self-pay | Admitting: Internal Medicine

## 2017-04-07 VITALS — BP 110/78 | HR 60 | Resp 12 | Ht 59.0 in | Wt 94.0 lb

## 2017-04-07 DIAGNOSIS — E782 Mixed hyperlipidemia: Secondary | ICD-10-CM

## 2017-04-07 DIAGNOSIS — I10 Essential (primary) hypertension: Secondary | ICD-10-CM | POA: Diagnosis not present

## 2017-04-07 DIAGNOSIS — Z720 Tobacco use: Secondary | ICD-10-CM

## 2017-04-07 DIAGNOSIS — E042 Nontoxic multinodular goiter: Secondary | ICD-10-CM | POA: Diagnosis not present

## 2017-04-07 DIAGNOSIS — R630 Anorexia: Secondary | ICD-10-CM

## 2017-04-07 DIAGNOSIS — Z1231 Encounter for screening mammogram for malignant neoplasm of breast: Secondary | ICD-10-CM

## 2017-04-07 DIAGNOSIS — Z1239 Encounter for other screening for malignant neoplasm of breast: Secondary | ICD-10-CM

## 2017-04-07 DIAGNOSIS — R634 Abnormal weight loss: Secondary | ICD-10-CM

## 2017-04-07 MED ORDER — ATORVASTATIN CALCIUM 10 MG PO TABS: ORAL_TABLET | ORAL | 11 refills | Status: DC

## 2017-04-07 NOTE — Patient Instructions (Signed)
Tobacco Cessation:   [id number] or [phone], the former for support and possibly free nicotine patches/gum and support; the latter for [facility] Smoking cessation class. Get rid of all smoking supplies:  Cigarettes, lighters, ashtrays--no stashes just in case at home if you are serious.  For Nicotine gum:  When you feel need for smoking:  Place nicotine gum in mouth and chew until juicy, then stick between gum and cheek.  You will absorb the nicotine from your mouth.   Do not aggressively chew gum. Spit gum out in garbage once you feel you are done with it. 

## 2017-04-07 NOTE — Progress Notes (Signed)
   Subjective:    Patient ID: Julia Maldonado, female    DOB: [birth year], 71 y.o.   MRN: [id number]  HPI   1.  Essential Hypertension:  Only taking the 2.5 mg of Amlodipine since last visit about 6 months ago.  No further episodes of shakiness.  2.  Multinodular goiter:  Last TSH in September was in therapeutic range.  Energy is fine.  No tremulousness or anxiousness.  Feels swallowing is improved since starting Levothyroxine for growth suppression. No heat intolerance.  Appetite is not good.  Only eating once daily.  No abdominal pain.  No early satiety.  No melena or hematochezia.  3. Hyperlipidemia:  Not taking Atorvastatin.  Reports pharmacy will not fill it for her.  Later states she thought because her cholesterol was better, I stopped filling the prescription.  Discussed that was not the case.     4.  Interested in citizenship, but has only lived in [place]. For 3 years.  Needs to be here 5 years before can become citizen. Current Meds  Medication Sig  . amLODipine (NORVASC) 5 MG tablet 1/2 tab by mouth daily  . levothyroxine (SYNTHROID, LEVOTHROID) 25 MCG tablet Take 1 tablet (25 mcg total) by mouth daily before breakfast.    No Known Allergies    Review of Systems     Objective:   Physical Exam   NAD Thin, petite woman HEENT:  PERRL, EOMI, TMs pearly gray, throat without injection, no oral lesion, but remaining teeth dark brown. Neck:  Supple, nodular thyroid Chest:  CTA CV:  RRR without murmur or rub, no S3 or S4.  Radial and DP pulses normal and equal. LE:  No edema         Assessment & Plan:  1.  Essential Hypertension:  Controlled with low dose Amlodipine.  2.  Multinodular Goiter:  Check TSH.  Feels improved swallowing with suppressive levothyroxine.  3.  Hyperlipidemia:  Was at goal when taking Atorvastatin.  Restart.  Will recheck labs near CPE in 2-3 months.  4.  Lack of appetite with mild weight loss:  CBC, CMP  5.  HM:  CPE in 2-3 months.  Mammogram

## 2017-04-08 LAB — CBC WITH DIFFERENTIAL/PLATELET
BASOS ABS: 0 10*3/uL (ref 0.0–0.2)
Basos: 0 %
EOS (ABSOLUTE): 0.2 10*3/uL (ref 0.0–0.4)
Eos: 2 %
HEMATOCRIT: 41.6 % (ref 34.0–46.6)
Hemoglobin: 13.5 g/dL (ref 11.1–15.9)
Immature Grans (Abs): 0 10*3/uL (ref 0.0–0.1)
Immature Granulocytes: 0 %
LYMPHS ABS: 2.6 10*3/uL (ref 0.7–3.1)
Lymphs: 31 %
MCH: 29.6 pg (ref 26.6–33.0)
MCHC: 32.5 g/dL (ref 31.5–35.7)
MCV: 91 fL (ref 79–97)
MONOS ABS: 0.6 10*3/uL (ref 0.1–0.9)
Monocytes: 7 %
Neutrophils Absolute: 4.8 10*3/uL (ref 1.4–7.0)
Neutrophils: 60 %
Platelets: 180 10*3/uL (ref 150–379)
RBC: 4.56 x10E6/uL (ref 3.77–5.28)
RDW: 13.1 % (ref 12.3–15.4)
WBC: 8.2 10*3/uL (ref 3.4–10.8)

## 2017-04-08 LAB — COMPREHENSIVE METABOLIC PANEL
ALBUMIN: 4.5 g/dL (ref 3.5–4.8)
ALK PHOS: 98 IU/L (ref 39–117)
ALT: 11 IU/L (ref 0–32)
AST: 20 IU/L (ref 0–40)
Albumin/Globulin Ratio: 1.4 (ref 1.2–2.2)
BILIRUBIN TOTAL: 0.5 mg/dL (ref 0.0–1.2)
BUN / CREAT RATIO: 15 (ref 12–28)
BUN: 10 mg/dL (ref 8–27)
CHLORIDE: 106 mmol/L (ref 96–106)
CO2: 20 mmol/L (ref 20–29)
Calcium: 9.9 mg/dL (ref 8.7–10.3)
Creatinine, Ser: 0.67 mg/dL (ref 0.57–1.00)
GFR calc Af Amer: 102 mL/min/{1.73_m2} (ref >59)
GFR calc non Af Amer: 89 mL/min/{1.73_m2} (ref >59)
GLOBULIN, TOTAL: 3.2 g/dL (ref 1.5–4.5)
GLUCOSE: 85 mg/dL (ref 65–99)
POTASSIUM: 4.7 mmol/L (ref 3.5–5.2)
SODIUM: 144 mmol/L (ref 134–144)
Total Protein: 7.7 g/dL (ref 6.0–8.5)

## 2017-04-08 LAB — TSH: TSH: 0.47 u[IU]/mL (ref 0.450–4.500)

## 2017-06-14 ENCOUNTER — Other Ambulatory Visit: Payer: Self-pay | Admitting: Internal Medicine

## 2017-07-12 ENCOUNTER — Ambulatory Visit
Admission: RE | Admit: 2017-07-12 | Discharge: 2017-07-12 | Disposition: A | Discharge status: Home / Self Care | Payer: Medicaid Other | Source: Ambulatory Visit | Attending: Internal Medicine | Admitting: Internal Medicine

## 2017-07-12 DIAGNOSIS — Z1239 Encounter for other screening for malignant neoplasm of breast: Secondary | ICD-10-CM

## 2017-07-14 ENCOUNTER — Encounter: Payer: Self-pay | Admitting: Internal Medicine

## 2017-07-14 ENCOUNTER — Ambulatory Visit (INDEPENDENT_AMBULATORY_CARE_PROVIDER_SITE_OTHER): Payer: Medicaid Other | Admitting: Internal Medicine

## 2017-07-14 VITALS — BP 122/80 | HR 70 | Resp 12 | Ht 59.0 in | Wt 95.0 lb

## 2017-07-14 DIAGNOSIS — K029 Dental caries, unspecified: Secondary | ICD-10-CM | POA: Diagnosis not present

## 2017-07-14 DIAGNOSIS — I10 Essential (primary) hypertension: Secondary | ICD-10-CM

## 2017-07-14 DIAGNOSIS — E042 Nontoxic multinodular goiter: Secondary | ICD-10-CM

## 2017-07-14 DIAGNOSIS — H269 Unspecified cataract: Secondary | ICD-10-CM | POA: Insufficient documentation

## 2017-07-14 DIAGNOSIS — Z0000 Encounter for general adult medical examination without abnormal findings: Secondary | ICD-10-CM

## 2017-07-14 DIAGNOSIS — F341 Dysthymic disorder: Secondary | ICD-10-CM | POA: Diagnosis not present

## 2017-07-14 HISTORY — DX: Unspecified cataract: H26.9

## 2017-07-14 MED ORDER — LEVOTHYROXINE SODIUM 25 MCG PO TABS: ORAL_TABLET | ORAL | 11 refills | Status: DC

## 2017-07-14 NOTE — Progress Notes (Signed)
Subjective:    Patient ID: Julia Maldonado, female    DOB: August 08, 1946, 71 y.o.   MRN: [id number]  HPI   CPE with pap  1.  Pap:  Had one and only pap smear at age 50 yo when first came to [place].  Performed at [facility] and reportedly normal.  No family history of cervical cancer.  2.  Mammogram: Last and only mammogram was 2 days ago, 07/12/2017, and normal. No family history of breast cancer.  3.  Osteoprevention:  No dairy intake.  No family history with signs or symptoms of osteoporosis.  Would be willing to drink milk, no problems with Lactose.  Does do some walking.    4.  Guaiac Cards:  Has performed before.  Cannot recall when.  5.  Colonoscopy:  Never performed.  No family history of colon cancer.  6.  Immunizations:  Has not yet obtained Influenza vaccine for the year. Immunization History  Administered Date(s) Administered  . Hepatitis B 08/05/2012, 10/07/2012, 04/05/2013  . Influenza,inj,Quad PF,6+ Mos 07/07/2016  . MMR 04/05/2013  . Pneumococcal Conjugate-13 07/06/2016  . Pneumococcal Polysaccharide-23 04/05/2013  . Td 08/05/2012, 04/27/2014  . Tdap 04/05/2013    7.  Glucose/Cholesterol:  No history of elevated glucose.  Does have hyperlipidemia, for which she is treated with Atorvastatin. Lipid Panel     Component Value Date/Time   CHOL 149 07/06/2016 0919   TRIG 114 07/06/2016 0919   HDL 43 07/06/2016 0919   LDLCALC 83 07/06/2016 0919    Out of Levothyroxine--pharmacy did not fill 1 month ago and has been out since.   Current Meds  Medication Sig  . amLODipine (NORVASC) 5 MG tablet 1/2 tab by mouth daily  . atorvastatin (LIPITOR) 10 MG tablet 1 tab by mouth with evening meal daily    No Known Allergies   . Review of Systems  Constitutional: Negative for fatigue and unexpected weight change.  HENT: Positive for dental problem (Teeth all stained and loose.  From chewing betel nuts and tobacco).        No throat, tongue or mouth pain or lesions.  Eyes: Negative for  visual disturbance.  Respiratory: Negative for cough and shortness of breath.   Cardiovascular: Negative for chest pain, palpitations and leg swelling.  Gastrointestinal: Negative for abdominal pain, blood in stool (no melena), constipation and diarrhea.  Genitourinary: Negative for dysuria, frequency, urgency, vaginal bleeding and vaginal discharge.  Musculoskeletal: Negative for arthralgias.  Skin: Negative for rash.  Neurological: Negative for dizziness, weakness and numbness.  Hematological: Negative for adenopathy. Does not bruise/bleed easily.  Psychiatric/Behavioral: Positive for dysphoric mood (misses children in [place], though able to talk to them.  Would be interested in working with Macie Burows, LCSW regarding this.  Her daughters are still working through the process of getting here. ).       Objective:   Physical Exam  Constitutional: She is oriented to person, place, and time. She appears well-developed and well-nourished.  HENT:  Head: Normocephalic and atraumatic.  Right Ear: Hearing, tympanic membrane, external ear and ear canal normal.  Left Ear: Hearing, tympanic membrane, external ear and ear canal normal.  Nose: Nose normal.  Mouth/Throat: Oropharynx is clear and moist and mucous membranes are normal. Abnormal dentition (Tobacco and nut blackened teeth.  Lower incisors are all amlmost falling out.  Missing many teeth.).  Eyes: Pupils are equal, round, and reactive to light. Conjunctivae and EOM are normal.  Bilateral cataracts obstruct view of discs.  Neck:  Normal range of motion and full passive range of motion without pain. Neck supple. Thyromegaly (large left thyroid nodule remains) present.  Cardiovascular: Normal rate, regular rhythm, S1 normal and S2 normal.  Exam reveals no S3, no S4 and no friction rub.   No murmur heard. Pulmonary/Chest: Effort normal and breath sounds normal. Right breast exhibits no inverted nipple, no mass, no nipple discharge, no skin  change and no tenderness. Left breast exhibits no inverted nipple, no mass, no nipple discharge, no skin change and no tenderness.  Abdominal: Soft. Bowel sounds are normal. She exhibits no mass. There is no hepatosplenomegaly. There is no tenderness. No hernia.  Genitourinary:  Genitourinary Comments: Refused pelvic and rectal exams today  Musculoskeletal: Normal range of motion.  Lymphadenopathy:       Head (right side): No submental and no submandibular adenopathy present.       Head (left side): No submental and no submandibular adenopathy present.    She has no cervical adenopathy.    She has no axillary adenopathy.       Right: No inguinal and no supraclavicular adenopathy present.       Left: No inguinal and no supraclavicular adenopathy present.  Neurological: She is alert and oriented to person, place, and time. She has normal strength and normal reflexes. No cranial nerve deficit or sensory deficit. Coordination and gait normal.  Skin: Skin is warm and dry. No rash noted.  Psychiatric: She has a normal mood and affect. Her speech is normal and behavior is normal.     Refused pelvic and rectal exams     Assessment & Plan:  1.  CPE without pelvic/pap/or rectal exams Mammogram recently normal Guaiac cards to return in 2 weeks.  2.  Multinodular goiter:  Still quite large.  No symptoms. Benign FNA of 3 nodules 05/2016   3.  Bilateral cataracts:  Referral to ophthalmology  4.  Essential Hypertension:  Controlled  5.  Hyperlipidemia:  Due for FLP will return in 1 month for this with TSH and CMP, CBC  6.  Dental decay/oral tobacco use:  No interest in quitting.  Encouraged attendance to free oral cancer screen on October 18th.  Encouraged being seen by dentist.  7.  Family Separation:  Referral to LCSW, Macie Burows.

## 2017-07-23 NOTE — Progress Notes (Signed)
Referral OV notes and Demographics faxed to Dr. Zenia Resides office. Patient has appointment scheduled for 08/20/17 @ 12 pm. Ree Mo is contact and Ree Mo has been notified.

## 2017-08-02 ENCOUNTER — Other Ambulatory Visit (INDEPENDENT_AMBULATORY_CARE_PROVIDER_SITE_OTHER): Payer: Medicaid Other

## 2017-08-02 DIAGNOSIS — R195 Other fecal abnormalities: Secondary | ICD-10-CM

## 2017-08-02 DIAGNOSIS — Z1211 Encounter for screening for malignant neoplasm of colon: Secondary | ICD-10-CM | POA: Diagnosis not present

## 2017-08-02 LAB — POC HEMOCCULT BLD/STL (HOME/3-CARD/SCREEN)
FECAL OCCULT BLD: NEGATIVE
FECAL OCCULT BLD: NEGATIVE
FECAL OCCULT BLD: POSITIVE

## 2017-08-03 NOTE — Progress Notes (Unsigned)
   Subjective:    Patient ID: Julia Maldonado, female    DOB: 1945/11/02, 71 y.o.   MRN: [id number]  HPI    Review of Systems     Objective:   Physical Exam        Assessment & Plan:

## 2017-08-05 ENCOUNTER — Encounter: Payer: Self-pay | Admitting: Gastroenterology

## 2017-08-06 ENCOUNTER — Ambulatory Visit (INDEPENDENT_AMBULATORY_CARE_PROVIDER_SITE_OTHER): Payer: Medicaid Other | Admitting: Internal Medicine

## 2017-08-06 ENCOUNTER — Encounter: Payer: Self-pay | Admitting: Internal Medicine

## 2017-08-06 VITALS — BP 122/80 | HR 72 | Resp 12 | Ht 59.0 in | Wt 96.0 lb

## 2017-08-06 DIAGNOSIS — R195 Other fecal abnormalities: Secondary | ICD-10-CM

## 2017-08-06 DIAGNOSIS — Z23 Encounter for immunization: Secondary | ICD-10-CM

## 2017-08-06 NOTE — Progress Notes (Signed)
Patient scheduled for GI appt. Information in system. Patient scheduled for 09/30/17

## 2017-08-06 NOTE — Progress Notes (Signed)
   Subjective:    Patient ID: Julia Maldonado, female    DOB: 1946/03/15, 71 y.o.   MRN: [id number]  HPI   Heme positive stool:  Here to discuss her 1 of 3 stools were heme positive though her hemoglobin was normal with her CPE Rare lower abdominal discomfort and stools are regular.  Current Meds  Medication Sig  . amLODipine (NORVASC) 5 MG tablet 1/2 tab by mouth daily  . atorvastatin (LIPITOR) 10 MG tablet 1 tab by mouth with evening meal daily    No Known Allergies    Review of Systems     Objective:   Physical Exam NAD LUngs:  CTA CV:  RRR without murmur or rub.  Radial pulses normal and equal.       Assessment & Plan:  Heme positive stool 1 or 3 cards.   After discussing the process of colonoscopy, she is fine with proceeding with the study. Has already been scheduled. Influenza vaccine today. Discussed she did receive list of Dentists who see Medicaid recipients at CPE

## 2017-08-13 ENCOUNTER — Other Ambulatory Visit: Payer: Medicaid Other

## 2017-08-16 ENCOUNTER — Other Ambulatory Visit (INDEPENDENT_AMBULATORY_CARE_PROVIDER_SITE_OTHER): Payer: Medicaid Other

## 2017-08-16 DIAGNOSIS — E041 Nontoxic single thyroid nodule: Secondary | ICD-10-CM | POA: Diagnosis not present

## 2017-08-16 DIAGNOSIS — Z79899 Other long term (current) drug therapy: Secondary | ICD-10-CM

## 2017-08-16 DIAGNOSIS — E782 Mixed hyperlipidemia: Secondary | ICD-10-CM

## 2017-08-17 LAB — COMPREHENSIVE METABOLIC PANEL
ALBUMIN: 4.3 g/dL (ref 3.5–4.8)
ALT: 11 IU/L (ref 0–32)
AST: 17 IU/L (ref 0–40)
Albumin/Globulin Ratio: 1.4 (ref 1.2–2.2)
Alkaline Phosphatase: 94 IU/L (ref 39–117)
BUN / CREAT RATIO: 18 (ref 12–28)
BUN: 12 mg/dL (ref 8–27)
Bilirubin Total: 0.5 mg/dL (ref 0.0–1.2)
CALCIUM: 9.2 mg/dL (ref 8.7–10.3)
CO2: 23 mmol/L (ref 20–29)
Chloride: 107 mmol/L — ABNORMAL HIGH (ref 96–106)
Creatinine, Ser: 0.66 mg/dL (ref 0.57–1.00)
GFR, EST AFRICAN AMERICAN: 103 mL/min/{1.73_m2} (ref >59)
GFR, EST NON AFRICAN AMERICAN: 89 mL/min/{1.73_m2} (ref >59)
GLUCOSE: 81 mg/dL (ref 65–99)
Globulin, Total: 3 g/dL (ref 1.5–4.5)
Potassium: 4 mmol/L (ref 3.5–5.2)
Sodium: 146 mmol/L — ABNORMAL HIGH (ref 134–144)
TOTAL PROTEIN: 7.3 g/dL (ref 6.0–8.5)

## 2017-08-17 LAB — LIPID PANEL W/O CHOL/HDL RATIO
Cholesterol, Total: 163 mg/dL (ref 100–199)
HDL: 47 mg/dL (ref >39)
LDL CALC: 89 mg/dL (ref 0–99)
Triglycerides: 136 mg/dL (ref 0–149)
VLDL CHOLESTEROL CAL: 27 mg/dL (ref 5–40)

## 2017-08-17 LAB — TSH: TSH: 0.47 u[IU]/mL (ref 0.450–4.500)

## 2017-08-19 ENCOUNTER — Ambulatory Visit (INDEPENDENT_AMBULATORY_CARE_PROVIDER_SITE_OTHER): Payer: Self-pay | Admitting: Licensed Clinical Social Worker

## 2017-08-19 DIAGNOSIS — F439 Reaction to severe stress, unspecified: Secondary | ICD-10-CM

## 2017-08-26 ENCOUNTER — Other Ambulatory Visit: Payer: Self-pay | Admitting: Licensed Clinical Social Worker

## 2017-08-26 DIAGNOSIS — F439 Reaction to severe stress, unspecified: Secondary | ICD-10-CM

## 2017-08-26 NOTE — Progress Notes (Signed)
THERAPY PROGRESS NOTE  Session Time: 60 min   Participation Level: Active  Behavioral Response: NeatAlertNA  Type of Therapy: Individual Therapy  Treatment Goals addressed: Coping  Interventions: Supportive  Summary: Vanisha Whiten is a 71 y.o. female who presents with a positive mood and appropriate affect. Tamla Winkels reported feeling good today.     Suicidal/Homicidal: Nowithout intent/plan Jabrea Kallstrom speaks [place] and needs an interpreter to communicate. Tamala Manzer disclosed feeling sad and worried as she would like to have her family together. Lollie Gunner also reported not being able to sleep some nights as distressing thoughts come to her mind. [name] reported having high blood pressure and having to take medications. However, Parilee Hally does not feel like the medication is helping her feel better. She worried that if she dies, her children will not be able to afford the expenses of her burial. Additionally, Kima Malenfant reported having a daughter who is married and has 3 children living in [place]. She reported having children in [place] who she misses. Jannett Schmall reported stressing out about having her family members far away. Jeena Arnett disclosed that since she came to the [place] 5 years ago with three of her children, neither she nor her husband have been able to work. Antanisha Mohs reported having a feeling of worthlessness because she is not able to work and contribute to the household. Laquitha Heslin reported staying at home most of the time as she has no form of transportation and relies on her family members to move around. Her support system consists mainly of her immediate family members. She has very few friends and does not socialize much. She reported being a [place] and going to church often. However, she does not have any close relationship with people at church.    Yarely Bebee lost her father at the age of 89. Her parents were farmers and when her father died they were unable to hold to their property. Her mother took her  sister and her from village to village working odd jobs. Her sister died approximately at the age of 44. She disclosed that one of the hardest things for her was moving from place to place not really knowing where she belonged. Fruma Africa married at approximately age 67. She disclosed her husband "took pity on her because her mother and she had no man to look after them" and that is the reason he married her.  Aysel Gilchrest has been married to her husband for the past 76 years. She had 10 children; however, only 7 of them remain alive. Juliza Sipp claimed having a good relationship with her children. She reported that her son in law is the sole provider of the household and that distresses her.       Therapist Response:  Network engineer performed a comprehensive clinical assessment on [employer]. However, due to time constraints, it could not be completed. Further information needs to be gathered to determine diagnose. CCA must be still be completed. Additionally, SWI asked "if there is one thing that I could help you with today, what it would be?" Zuzanna Maroney reported that since she has been living in the [place] for 5 years, she can now become a citizen. She would like SWI to help her with information about how to obtain her citizenship. SWI informed Shaketta Mo that the process to obtain her citizenship was of approximately 6 to 12 months. SWI informed Laylynn Mo that she would need to fill out Form  N-400 and pay a fee of $725 USD per individual. [name] expressed concern about having to do the citizenship test. SWI stated that she would find out more information for next session so Suttyn Cryder could make an informed decision regarding this issue. Plan: Return again in 1 week.  Diagnosis:    Nicola Police, [place] Work 08/26/2017

## 2017-09-02 ENCOUNTER — Other Ambulatory Visit (INDEPENDENT_AMBULATORY_CARE_PROVIDER_SITE_OTHER): Payer: Self-pay | Admitting: Licensed Clinical Social Worker

## 2017-09-02 DIAGNOSIS — Z603 Acculturation difficulty: Secondary | ICD-10-CM

## 2017-09-07 NOTE — Progress Notes (Signed)
   THERAPY PROGRESS NOTE  Session Time: 79min  Participation Level: Active  Behavioral Response: CasualAlertEuthymic  Type of Therapy: Individual Therapy  Treatment Goals addressed: Coping  Interventions: Motivational Interviewing and Supportive  Summary: Julia Maldonado is a 71 y.o. female who presents with a positive mood and appropriate affect. [name] reported feeling OK today. [name] reported still feeling sad. 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 [name] reported experiencing lack of sleep (2 to 3 hours per night), however, not feeling tired. She also reported having obsessive worries and feelings of worthlessness. [name] disclosed that she witnessed war in [place] and seeing people hurt and dying. She reported that was the reason she came to the [place]. She also disclosed that in the past there were periods in which she did not have enough to eat.  .   Suicidal/Homicidal: Nowithout intent/plan  Therapist Response: Network engineer performed a Fish farm manager on [employer]. However, due to time constraints, it could not be completed. SWI provided [name] information about how to obtain her citizenship. SWI informed Deah Mo that she would help out filling out the Form N-400 for citizenship and make a list of documents she would need to preset to the immigration offices.   Plan: Return again in [place].  Session and note by social work intern [address]  Metta Clines, [place] 09/07/2017

## 2017-09-15 ENCOUNTER — Other Ambulatory Visit (INDEPENDENT_AMBULATORY_CARE_PROVIDER_SITE_OTHER): Payer: Self-pay | Admitting: Licensed Clinical Social Worker

## 2017-09-15 DIAGNOSIS — Z603 Acculturation difficulty: Secondary | ICD-10-CM

## 2017-09-16 ENCOUNTER — Ambulatory Visit (AMBULATORY_SURGERY_CENTER): Payer: Self-pay

## 2017-09-16 VITALS — Ht 60.0 in | Wt 97.4 lb

## 2017-09-16 DIAGNOSIS — Z1211 Encounter for screening for malignant neoplasm of colon: Secondary | ICD-10-CM

## 2017-09-16 MED ORDER — SUPREP BOWEL PREP KIT 17.5-3.13-1.6 GM/177ML PO SOLN: 1.0000 | Freq: Once | ORAL | 0 refills | Status: AC
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
## 2017-09-16 NOTE — Progress Notes (Signed)
No allergies to eggs or soy No diet meds No home oxygen No past problems with anesthesia  Declined emmi 

## 2017-09-20 ENCOUNTER — Encounter: Payer: Self-pay | Admitting: Internal Medicine

## 2017-09-20 ENCOUNTER — Ambulatory Visit: Payer: Medicaid Other | Admitting: Internal Medicine

## 2017-09-20 VITALS — BP 128/80 | HR 72 | Resp 12 | Ht 59.0 in | Wt 94.0 lb

## 2017-09-20 DIAGNOSIS — R4189 Other symptoms and signs involving cognitive functions and awareness: Secondary | ICD-10-CM

## 2017-09-20 DIAGNOSIS — G8929 Other chronic pain: Secondary | ICD-10-CM | POA: Diagnosis not present

## 2017-09-20 DIAGNOSIS — M25511 Pain in right shoulder: Secondary | ICD-10-CM | POA: Diagnosis not present

## 2017-09-20 MED ORDER — IBUPROFEN 600 MG PO TABS: ORAL_TABLET | ORAL | 0 refills | Status: DC

## 2017-09-20 NOTE — Progress Notes (Signed)
   Subjective:    Patient ID: Julia Maldonado, female    DOB: 1946-07-06, 71 y.o.   MRN: [id number]  HPI   Right shoulder pain and neck pain for 2 months.   Constant pain.   Describes as stabbing.  She feels the pain starts at the lateral shoulder and radiates up to her right neck and down to the right wrist. No numbness or tingling.   Feels her arm is weak.  Abduction and perhaps internal rotation cause more pain. No history of injury. Does not recall overuse injury. Cannot recall how the pain started. Pain started and has not changed in intensity since started. Sounds like she has tried one of the topical camphor or similar muscle creams with some help.    2.  Concerns for citizenship and difficulty learning English Has not attempted English as a second language. Does not read or write Julia Maldonado.   MMSE scored 13, but limited in Attention and Calculation as well as reading by inability to read or write in her own language.  Current Meds  Medication Sig  . amLODipine (NORVASC) 5 MG tablet 1/2 tab by mouth daily  . atorvastatin (LIPITOR) 10 MG tablet 1 tab by mouth with evening meal daily  . levothyroxine (SYNTHROID, LEVOTHROID) 25 MCG tablet TAKE 1 TABLET(25 MCG) BY MOUTH DAILY BEFORE BREAKFAST    No Known Allergies    Review of Systems     Objective:   Physical Exam NAD Right shoulder:  Much pain with abduction above 90 degrees and pain with full flexion of shoulder.  Similarly with adduction across her chest.   Minimal tenderness over subdeltoid bursa.  Very tender over AC and somewhat CC joint.  No redness or swelling about the joint.  Tender over trap, not over cspine, however. 5/5 biceps/triceps strength.  Good grip.  MMSE - Mini Mental State Exam 09/20/2017  Orientation to time 0  Orientation to Place 1  Registration 3  Attention/ Calculation 0  Recall 2  Language- name 2 objects 2  Language- repeat 1  Language- follow 3 step command 3  Language- read & follow  direction 1  Write a sentence 0  Copy design 0  Total score 13         Assessment & Plan:  1.  Right shoulder pain: send for shoulder films.  Not clear how much of this may be rotator cuff vs.  AC joint pain.   PT referral through [facility] outpatient as well. Ibuprofen 600 mg twice daily with food for 1 week, then only as needed. Follow up in 2 months  2.  Memory/cognitive concerns/Citizenship.  MMSE scored 13. Testing limited due to cultural norms/inability to read and write in native language. Encouraged her to get started with English classes through [employer].  Will discuss her background of possible trauma that may relate to difficulties learning with LCSW/MSW

## 2017-09-21 NOTE — Progress Notes (Signed)
Note from Nicola Police, social work intern:  11.28.18 Julia Maldonado a 71 year-old [place] woman, presented with a positive mood and appropriate affect. Julia Maldonado reported feeling OK today.  Julia Maldonado reported still feeling worried about her children being in [place]. She also reported that currently she is receiving SSI and that she would be cut off these services if she does not become a citizen within the stipulated time (7 years). Julia Maldonado has no other financial resources besides SSI and the help her son in law and daughter provide for her. Julia Maldonado expressed that at this point the only service she is interested on is help with her immigration status.   Social Worker ([place]) Intern performed a Chartered loss adjuster on [employer]. Julia Maldonado does not seem to experience any difficulties with any of the people presented in her ecomap. SWI will address the issue of impaired cognition to help Julia Maldonado become a citizen. At this point, no further services are needed from [facility].

## 2017-09-23 ENCOUNTER — Ambulatory Visit
Admission: RE | Admit: 2017-09-23 | Discharge: 2017-09-23 | Disposition: A | Discharge status: Home / Self Care | Payer: Medicaid Other | Source: Ambulatory Visit | Attending: Internal Medicine | Admitting: Internal Medicine

## 2017-09-23 DIAGNOSIS — G8929 Other chronic pain: Secondary | ICD-10-CM

## 2017-09-23 DIAGNOSIS — M25511 Pain in right shoulder: Principal | ICD-10-CM

## 2017-09-30 ENCOUNTER — Ambulatory Visit (AMBULATORY_SURGERY_CENTER): Payer: Medicaid Other | Admitting: Gastroenterology

## 2017-09-30 ENCOUNTER — Other Ambulatory Visit: Payer: Self-pay

## 2017-09-30 ENCOUNTER — Encounter: Payer: Self-pay | Admitting: Gastroenterology

## 2017-09-30 VITALS — BP 122/70 | HR 67 | Temp 98.0°F | Resp 19 | Ht 59.0 in | Wt 94.0 lb

## 2017-09-30 DIAGNOSIS — D120 Benign neoplasm of cecum: Secondary | ICD-10-CM | POA: Diagnosis not present

## 2017-09-30 DIAGNOSIS — D122 Benign neoplasm of ascending colon: Secondary | ICD-10-CM | POA: Diagnosis not present

## 2017-09-30 DIAGNOSIS — Z1211 Encounter for screening for malignant neoplasm of colon: Secondary | ICD-10-CM

## 2017-09-30 MED ORDER — SODIUM CHLORIDE 0.9 % IV SOLN: 500.0000 mL | Freq: Once | INTRAVENOUS | Status: DC

## 2017-09-30 NOTE — Progress Notes (Signed)
Report to PACU, RN, vss, BBS= Clear.  

## 2017-09-30 NOTE — Progress Notes (Signed)
No changes in medical or surgical hx since PV per pt.  Daughter- Leodis Sias speaks Burmese and interpretor on the IPAD doesn't speak Loistine Simas but does speak Burmese so this is the language that is used to interpret.    Ipad left with patient and daughter to be used in the procedure room and recovery room.

## 2017-09-30 NOTE — Op Note (Signed)
[facility] Patient Name: Julia Maldonado Procedure Date: 09/30/2017 9:39 AM MRN: [id number] Endoscopist: Mauri Pole , MD Age: 71 Referring MD:  Date of Birth: 1946/10/01 Gender: Female Account #: [id number] Procedure:                Colonoscopy Indications:              Screening for colorectal malignant neoplasm, This                            is the patient's first colonoscopy Medicines:                Monitored Anesthesia Care Procedure:                Pre-Anesthesia Assessment:                           - Prior to the procedure, a History and Physical                            was performed, and patient medications and                            allergies were reviewed. The patient's tolerance of                            previous anesthesia was also reviewed. The risks                            and benefits of the procedure and the sedation                            options and risks were discussed with the patient.                            All questions were answered, and informed consent                            was obtained. Prior Anticoagulants: The patient has                            taken no previous anticoagulant or antiplatelet                            agents. ASA Grade Assessment: II - A patient with                            mild systemic disease. After reviewing the risks                            and benefits, the patient was deemed in                            satisfactory condition to undergo the procedure.  After obtaining informed consent, the colonoscope                            was passed under direct vision. Throughout the                            procedure, the patient's blood pressure, pulse, and                            oxygen saturations were monitored continuously. The                            Colonoscope was introduced through the anus and                            advanced to the the cecum,  identified by                            appendiceal orifice and ileocecal valve. The                            colonoscopy was performed without difficulty. The                            patient tolerated the procedure well. The quality                            of the bowel preparation was good. The ileocecal                            valve, appendiceal orifice, and rectum were                            photographed. Scope In: 9:57:05 AM Scope Out: 10:09:27 AM Scope Withdrawal Time: 0 hours 10 minutes 1 second  Total Procedure Duration: 0 hours 12 minutes 22 seconds  Findings:                 The perianal and digital rectal examinations were                            normal.                           A 2 mm polyp was found in the cecum. The polyp was                            sessile. The polyp was removed with a cold biopsy                            forceps. Resection and retrieval were complete.                           A 4 mm polyp was found in the ascending colon. The  polyp was sessile. The polyp was removed with a                            cold snare. Resection and retrieval were complete.                           Non-bleeding internal hemorrhoids were found during                            retroflexion. The hemorrhoids were medium-sized. Complications:            No immediate complications. Estimated Blood Loss:     Estimated blood loss was minimal. Impression:               - One 2 mm polyp in the cecum, removed with a cold                            biopsy forceps. Resected and retrieved.                           - One 4 mm polyp in the ascending colon, removed                            with a cold snare. Resected and retrieved.                           - Non-bleeding internal hemorrhoids. Recommendation:           - Patient has a contact number available for                            emergencies. The signs and symptoms of potential                             delayed complications were discussed with the                            patient. Return to normal activities tomorrow.                            Written discharge instructions were provided to the                            patient.                           - Resume previous diet.                           - Continue present medications.                           - Await pathology results.                           - Repeat colonoscopy in 5-10 years for surveillance  based on pathology results. Mauri Pole, MD 09/30/2017 10:15:13 AM This report has been signed electronically.

## 2017-09-30 NOTE — Patient Instructions (Signed)
Impression/Recommendations:  Polyp handout given to patient. Hemorrhoid handout given to patient.  Resume previous diet. Continue present medications.  Repeat colonoscopy in 5-10 years for surveillance based on pathology results.  YOU HAD AN ENDOSCOPIC PROCEDURE TODAY AT THE [facility]:   Refer to the procedure report that was given to you for any specific questions about what was found during the examination.  If the procedure report does not answer your questions, please call your gastroenterologist to clarify.  If you requested that your care partner not be given the details of your procedure findings, then the procedure report has been included in a sealed envelope for you to review at your convenience later.  YOU SHOULD EXPECT: Some feelings of bloating in the abdomen. Passage of more gas than usual.  Walking can help get rid of the air that was put into your GI tract during the procedure and reduce the bloating. If you had a lower endoscopy (such as a colonoscopy or flexible sigmoidoscopy) you may notice spotting of blood in your stool or on the toilet paper. If you underwent a bowel prep for your procedure, you may not have a normal bowel movement for a few days.  Please Note:  You might notice some irritation and congestion in your nose or some drainage.  This is from the oxygen used during your procedure.  There is no need for concern and it should clear up in a day or so.  SYMPTOMS TO REPORT IMMEDIATELY:   Following lower endoscopy (colonoscopy or flexible sigmoidoscopy):  Excessive amounts of blood in the stool  Significant tenderness or worsening of abdominal pains  Swelling of the abdomen that is new, acute  Fever of 100F or higher  For urgent or emergent issues, a gastroenterologist can be reached at any hour by calling [phone].   DIET:  We do recommend a small meal at first, but then you may proceed to your regular diet.  Drink plenty of fluids but you  should avoid alcoholic beverages for 24 hours.  ACTIVITY:  You should plan to take it easy for the rest of today and you should NOT DRIVE or use heavy machinery until tomorrow (because of the sedation medicines used during the test).    FOLLOW UP: Our staff will call the number listed on your records the next business day following your procedure to check on you and address any questions or concerns that you may have regarding the information given to you following your procedure. If we do not reach you, we will leave a message.  However, if you are feeling well and you are not experiencing any problems, there is no need to return our call.  We will assume that you have returned to your regular daily activities without incident.  If any biopsies were taken you will be contacted by phone or by letter within the next 1-3 weeks.  Please call us at [phone] if you have not heard about the biopsies in 3 weeks.    SIGNATURES/CONFIDENTIALITY: You and/or your care partner have signed paperwork which will be entered into your electronic medical record.  These signatures attest to the fact that that the information above on your After Visit Summary has been reviewed and is understood.  Full responsibility of the confidentiality of this discharge information lies with you and/or your care-partner. 

## 2017-09-30 NOTE — Progress Notes (Signed)
Called to room to assist during endoscopic procedure.  Patient ID and intended procedure confirmed with present staff. Received instructions for my participation in the procedure from the performing physician.  

## 2017-10-01 ENCOUNTER — Telehealth: Payer: Self-pay

## 2017-10-01 NOTE — Telephone Encounter (Signed)
  Follow up Call-  Call back number 09/30/2017  Post procedure Call Back phone  # [phone]  Permission to leave phone message Yes  Some recent data might be hidden     Patient questions:  Do you have a fever, pain , or abdominal swelling? No. Pain Score  0 *  Have you tolerated food without any problems? Yes.    Have you been able to return to your normal activities? Yes.    Do you have any questions about your discharge instructions: Diet   No. Medications  No. Follow up visit  No.  Do you have questions or concerns about your Care? No.  Actions: * If pain score is 4 or above: No action needed, pain <4.

## 2017-10-07 ENCOUNTER — Telehealth: Payer: Self-pay | Admitting: Physical Therapy

## 2017-10-07 NOTE — Telephone Encounter (Signed)
10/07/17 using interpreter services, pt stated she does not want to do PT

## 2017-10-14 ENCOUNTER — Encounter: Payer: Self-pay | Admitting: Gastroenterology

## 2017-10-26 ENCOUNTER — Other Ambulatory Visit: Payer: Self-pay | Admitting: Internal Medicine

## 2017-10-26 DIAGNOSIS — I10 Essential (primary) hypertension: Secondary | ICD-10-CM

## 2017-11-05 ENCOUNTER — Ambulatory Visit (INDEPENDENT_AMBULATORY_CARE_PROVIDER_SITE_OTHER): Payer: Self-pay | Admitting: Licensed Clinical Social Worker

## 2017-11-05 DIAGNOSIS — F4310 Post-traumatic stress disorder, unspecified: Secondary | ICD-10-CM
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
## 2017-11-25 ENCOUNTER — Other Ambulatory Visit: Payer: Self-pay | Admitting: Internal Medicine

## 2017-11-25 DIAGNOSIS — I10 Essential (primary) hypertension: Secondary | ICD-10-CM

## 2017-11-26 ENCOUNTER — Other Ambulatory Visit: Payer: Self-pay

## 2017-11-26 DIAGNOSIS — I10 Essential (primary) hypertension: Secondary | ICD-10-CM

## 2017-11-26 MED ORDER — AMLODIPINE BESYLATE 5 MG PO TABS: ORAL_TABLET | ORAL | 11 refills | Status: DC

## 2018-02-22 IMAGING — CR DG SHOULDER 2+V*R*
3 series · 3 of 3 positions shown · non-contrast
Comparison: None.

CLINICAL DATA: Pain for 2 months

EXAM:
RIGHT SHOULDER - 2+ VIEW

[w shoulder ap internal righ *]
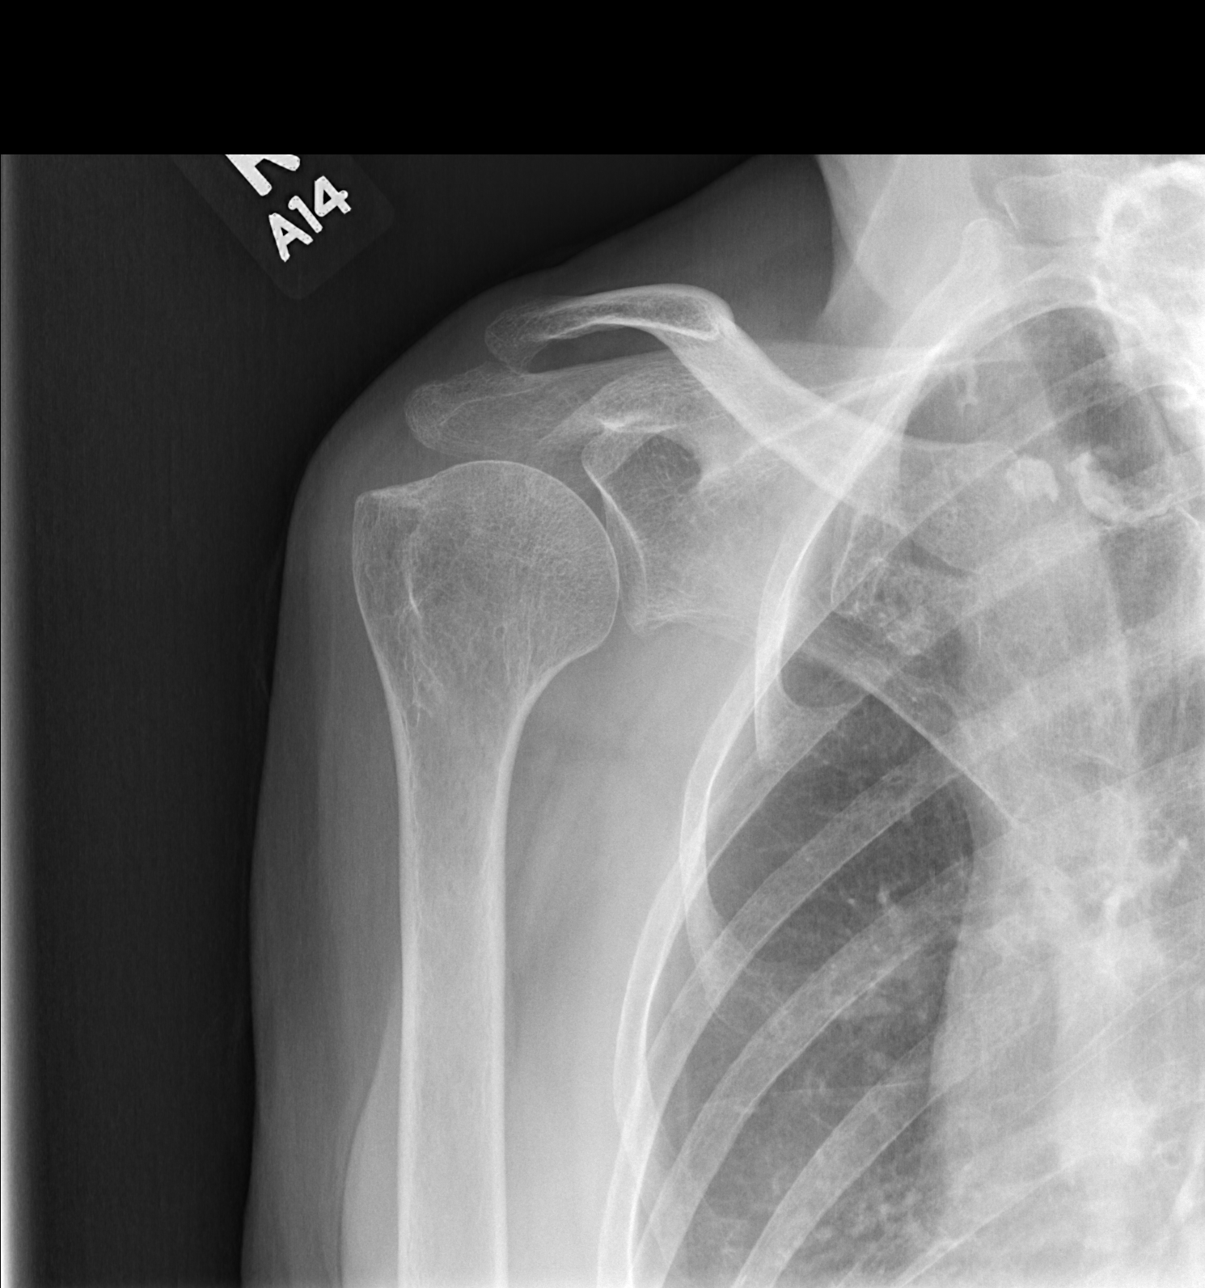

[w shoulder axillary right * (1 of 2)]
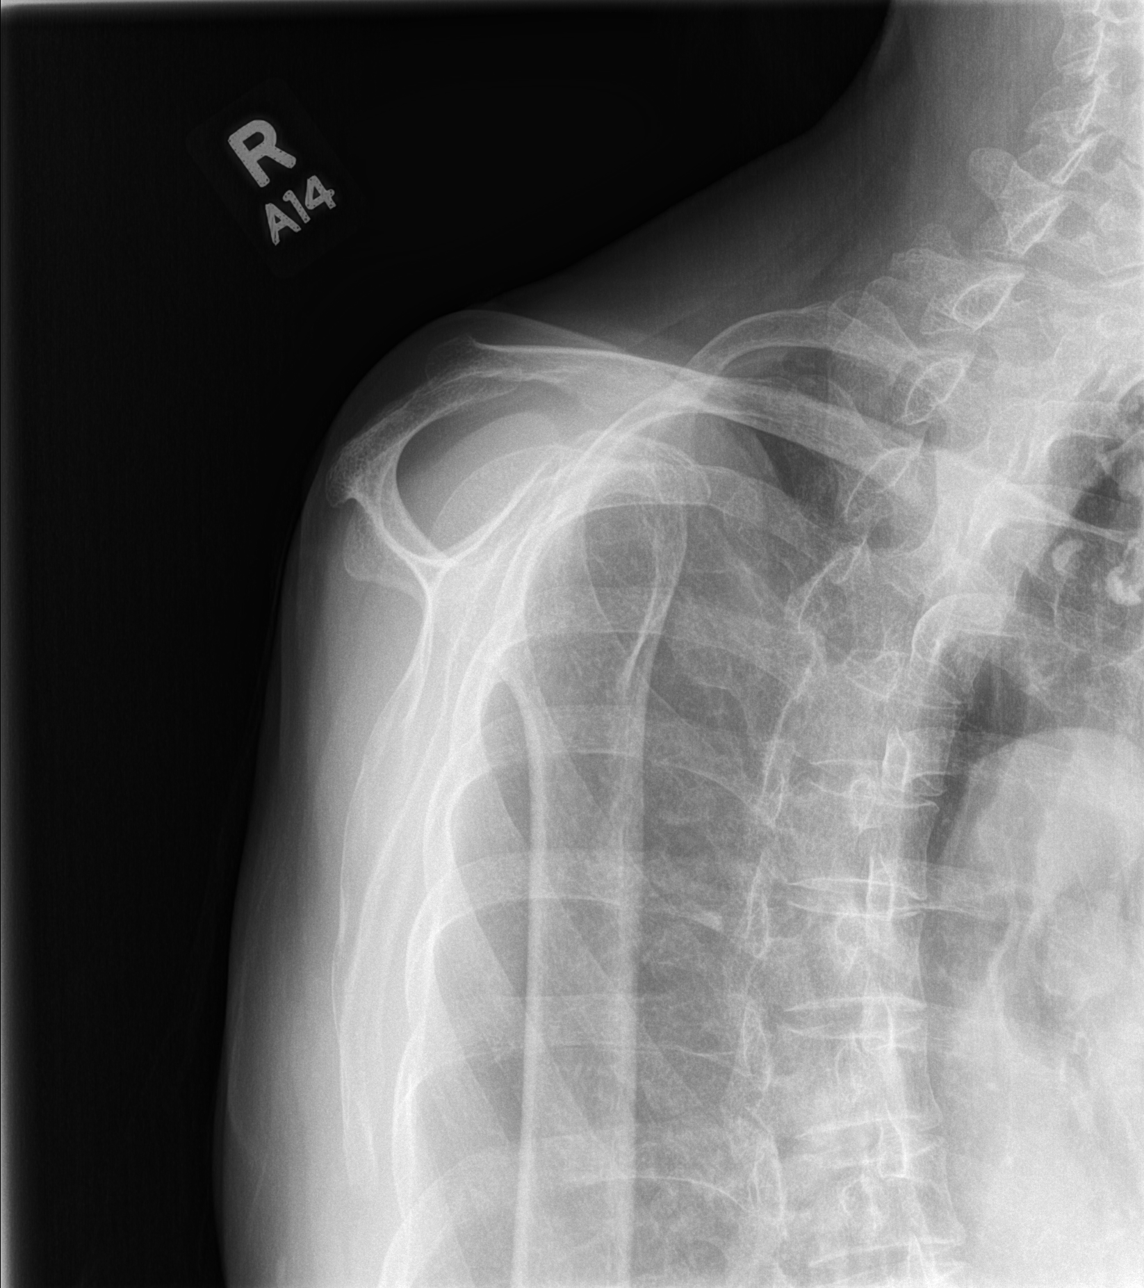

[w shoulder axillary right * (2 of 2)]
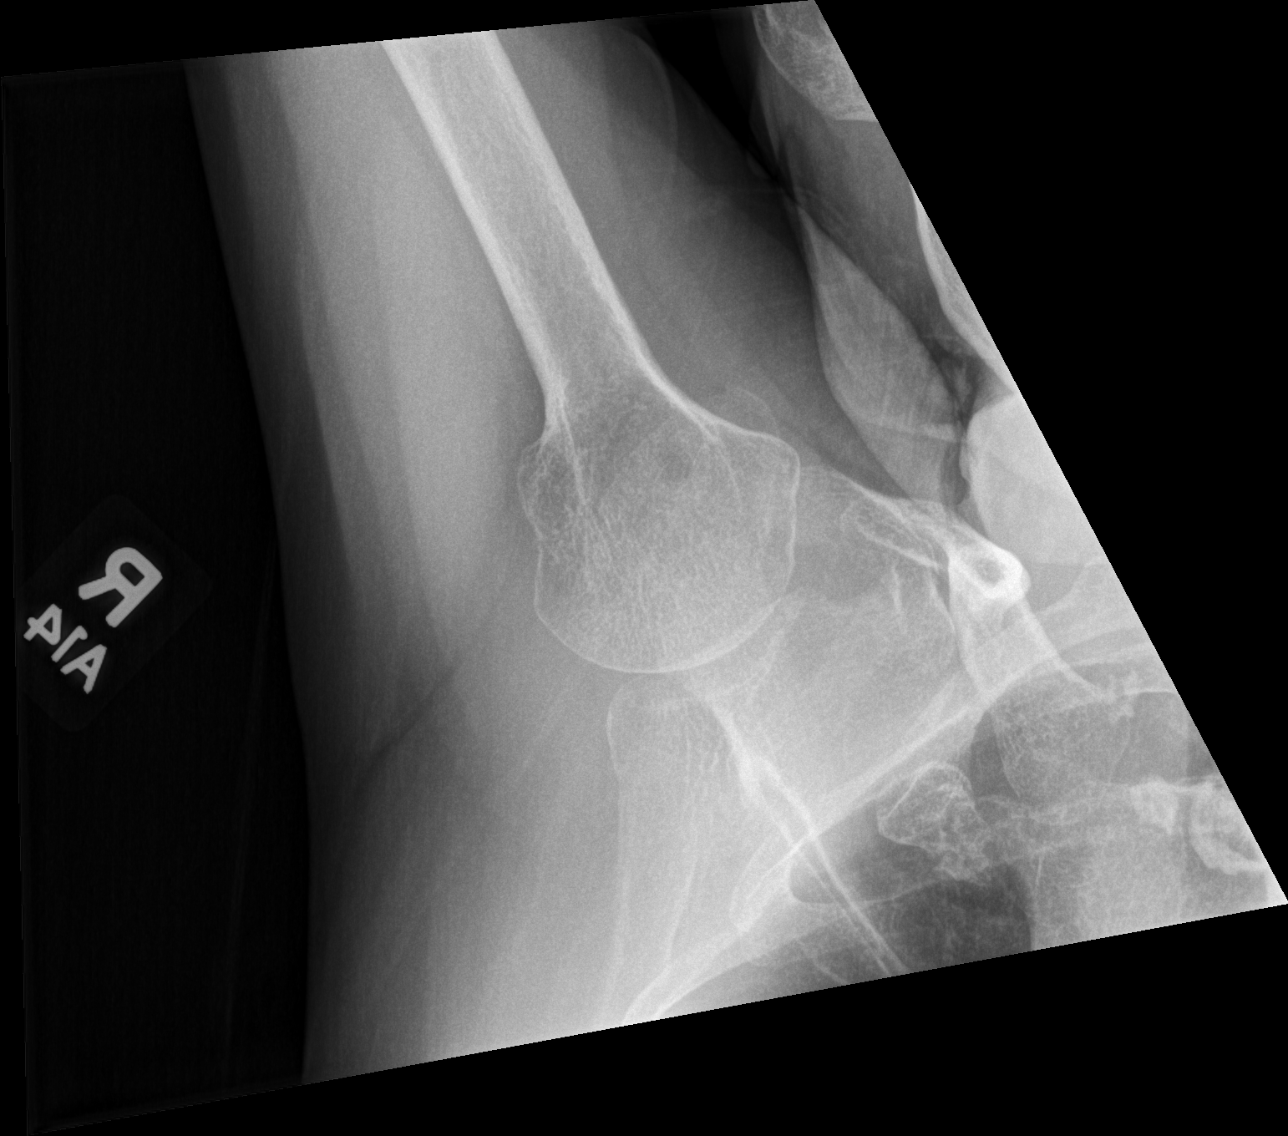

[3 of 3 positions shown; findings below may reference images not displayed]

FINDINGS: Frontal, Y scapular, and axillary images were obtained. No fracture
or dislocation evident. Joint spaces appear normal. No erosive
change. Visualized right lung is clear.
IMPRESSION: No fracture or dislocation.  No evident arthropathy.

## 2018-04-22 ENCOUNTER — Other Ambulatory Visit: Payer: Self-pay | Admitting: Internal Medicine

## 2018-04-22 DIAGNOSIS — E782 Mixed hyperlipidemia: Secondary | ICD-10-CM

## 2018-06-09 ENCOUNTER — Other Ambulatory Visit: Payer: Self-pay | Admitting: Internal Medicine

## 2018-06-09 DIAGNOSIS — I10 Essential (primary) hypertension: Secondary | ICD-10-CM

## 2018-08-29 NOTE — Congregational Nurse Program (Signed)
Julia Maldonado came in for Flu vaccine and blood pressure check. Same done.She has not seen PCP for a while. Waiting for medicaid card and she will be back to make appointment with PCP. Honor Loh RN BSn PCCN [phone]

## 2018-11-23 ENCOUNTER — Encounter: Payer: Self-pay | Admitting: Internal Medicine

## 2018-11-23 ENCOUNTER — Ambulatory Visit: Payer: Medicaid Other | Admitting: Internal Medicine

## 2018-11-23 VITALS — BP 122/80 | HR 68 | Resp 12 | Ht 59.0 in | Wt 97.0 lb

## 2018-11-23 DIAGNOSIS — E782 Mixed hyperlipidemia: Secondary | ICD-10-CM

## 2018-11-23 DIAGNOSIS — I10 Essential (primary) hypertension: Secondary | ICD-10-CM | POA: Diagnosis not present

## 2018-11-23 DIAGNOSIS — E042 Nontoxic multinodular goiter: Secondary | ICD-10-CM

## 2018-11-23 DIAGNOSIS — K029 Dental caries, unspecified: Secondary | ICD-10-CM
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
## 2018-11-24 LAB — COMPREHENSIVE METABOLIC PANEL
ALBUMIN: 4.2 g/dL (ref 3.7–4.7)
ALT: 11 IU/L (ref 0–32)
AST: 16 IU/L (ref 0–40)
Albumin/Globulin Ratio: 1.4 (ref 1.2–2.2)
Alkaline Phosphatase: 97 IU/L (ref 39–117)
BUN / CREAT RATIO: 19 (ref 12–28)
BUN: 10 mg/dL (ref 8–27)
Bilirubin Total: 0.2 mg/dL (ref 0.0–1.2)
CALCIUM: 9.1 mg/dL (ref 8.7–10.3)
CO2: 24 mmol/L (ref 20–29)
CREATININE: 0.52 mg/dL — AB (ref 0.57–1.00)
Chloride: 102 mmol/L (ref 96–106)
GFR, EST AFRICAN AMERICAN: 110 mL/min/{1.73_m2} (ref >59)
GFR, EST NON AFRICAN AMERICAN: 95 mL/min/{1.73_m2} (ref >59)
GLOBULIN, TOTAL: 3 g/dL (ref 1.5–4.5)
Glucose: 93 mg/dL (ref 65–99)
Potassium: 4.3 mmol/L (ref 3.5–5.2)
Sodium: 142 mmol/L (ref 134–144)
TOTAL PROTEIN: 7.2 g/dL (ref 6.0–8.5)

## 2018-11-24 LAB — TSH: TSH: 0.479 u[IU]/mL (ref 0.450–4.500)

## 2018-11-24 LAB — LIPID PANEL W/O CHOL/HDL RATIO
Cholesterol, Total: 250 mg/dL — ABNORMAL HIGH (ref 100–199)
HDL: 42 mg/dL (ref >39)
LDL Calculated: 154 mg/dL — ABNORMAL HIGH (ref 0–99)
Triglycerides: 272 mg/dL — ABNORMAL HIGH (ref 0–149)
VLDL Cholesterol Cal: 54 mg/dL — ABNORMAL HIGH (ref 5–40)

## 2018-12-23 ENCOUNTER — Other Ambulatory Visit: Payer: Medicaid Other | Admitting: Internal Medicine

## 2020-02-08 ENCOUNTER — Ambulatory Visit: Payer: Medicaid Other | Attending: Internal Medicine

## 2020-02-08 DIAGNOSIS — Z23 Encounter for immunization: Secondary | ICD-10-CM

## 2020-02-08 NOTE — Progress Notes (Signed)
   Covid-19 Vaccination Clinic  Name:  Sheresa Tessmer    MRN: [id number] DOB: Oct 26, 1945  02/08/2020  Ms. Geitner was observed post Covid-19 immunization for 15 minutes without incident. She was provided with Vaccine Information Sheet and instruction to access the V-Safe system.   Ms. Allinder was instructed to call 911 with any severe reactions post vaccine: Marland Kitchen Difficulty breathing  . Swelling of face and throat  . A fast heartbeat  . A bad rash all over body  . Dizziness and weakness   Immunizations Administered    Name Date Dose VIS Date Route   Pfizer COVID-19 Vaccine 02/08/2020  3:48 PM 0.3 mL 12/13/2018 Intramuscular   Manufacturer: [place]   Lot: [id number]   [place]: [id number]

## 2020-03-04 ENCOUNTER — Ambulatory Visit: Payer: Medicaid Other | Attending: Internal Medicine

## 2020-03-04 DIAGNOSIS — Z23 Encounter for immunization: Secondary | ICD-10-CM

## 2020-03-04 NOTE — Progress Notes (Signed)
   Covid-19 Vaccination Clinic  Name:  Julia Maldonado    MRN: [id number] DOB: [birth year]  03/04/2020  Ms. Julia Maldonado was observed post Covid-19 immunization for 15 minutes without incident. She was provided with Vaccine Information Sheet and instruction to access the V-Safe system.   Ms. Julia Maldonado was instructed to call 911 with any severe reactions post vaccine: Marland Kitchen Difficulty breathing  . Swelling of face and throat  . A fast heartbeat  . A bad rash all over body  . Dizziness and weakness   Immunizations Administered    Name Date Dose VIS Date Route   Pfizer COVID-19 Vaccine 03/04/2020  3:16 PM 0.3 mL 12/13/2018 Intramuscular   Manufacturer: [place]   Lot: [id number]   [place]: [id number]

## 2020-04-22 ENCOUNTER — Other Ambulatory Visit: Payer: Self-pay

## 2020-04-22 ENCOUNTER — Emergency Department (HOSPITAL_COMMUNITY): Payer: Medicare Other

## 2020-04-22 ENCOUNTER — Emergency Department (HOSPITAL_COMMUNITY)
Admission: EM | Admit: 2020-04-22 | Discharge: 2020-04-23 | Disposition: A | Discharge status: Home / Self Care | Payer: Medicare Other | Attending: Emergency Medicine | Admitting: Emergency Medicine

## 2020-04-22 ENCOUNTER — Encounter (HOSPITAL_COMMUNITY): Payer: Self-pay | Admitting: *Deleted

## 2020-04-22 DIAGNOSIS — Y9301 Activity, walking, marching and hiking: Secondary | ICD-10-CM | POA: Diagnosis not present

## 2020-04-22 DIAGNOSIS — F17220 Nicotine dependence, chewing tobacco, uncomplicated: Secondary | ICD-10-CM | POA: Insufficient documentation

## 2020-04-22 DIAGNOSIS — R0781 Pleurodynia: Secondary | ICD-10-CM

## 2020-04-22 DIAGNOSIS — I10 Essential (primary) hypertension: Secondary | ICD-10-CM | POA: Diagnosis not present

## 2020-04-22 DIAGNOSIS — W01190A Fall on same level from slipping, tripping and stumbling with subsequent striking against furniture, initial encounter: Secondary | ICD-10-CM | POA: Insufficient documentation

## 2020-04-22 DIAGNOSIS — W19XXXA Unspecified fall, initial encounter: Secondary | ICD-10-CM

## 2020-04-22 DIAGNOSIS — Y92010 Kitchen of single-family (private) house as the place of occurrence of the external cause: Secondary | ICD-10-CM | POA: Insufficient documentation

## 2020-04-22 MED ORDER — OXYCODONE-ACETAMINOPHEN 5-325 MG PO TABS
1.0000 | ORAL_TABLET | Freq: Once | ORAL | Status: AC
  Administered 2020-04-22: 23:00:00 1 via ORAL
  Filled 2020-04-22: qty 1

## 2020-04-22 NOTE — ED Provider Notes (Signed)
[facility] Provider Note   CSN: [id number] Arrival date & time: 04/22/20  1918     History Chief Complaint  Patient presents with  . Fall    Julia Maldonado is a 74 y.o. female.  The history is provided by the patient and medical records.  Fall Associated symptoms include chest pain (right ribs).    74 y.o. F with hx of dental decay, HTN, multinodular goiter, presenting to the ED with right rib pain.  History difficulty due to language barrier-- patient only speaks [place] and interpreter is not available through our provided service.  Patient's granddaughter is at bedside and is assisting with interpretation.  States she was walking through the kitchen today and stepped on a plastic bag that was on the floor and slipped.  States her right ribs impacted a desk nearby but there was no fall to the ground, head injury, or loss of consciousness.  Patient reports continued pain of the right lower anterior ribs.  States she has increased pain with breathing but denies SOB.  No intervention tried PTA.  Denies any other areas of pain or other symptoms.  Past Medical History:  Diagnosis Date  . Bilateral cataracts 07/14/2017  . Dental decay   . Hyperlipidemia    on Atorvastatin  . Hypertension ?2014  . Multinodular goiter 02/12/2016  . Neck pain 02/12/2016    Patient Active Problem List   Diagnosis Date Noted  . Bilateral cataracts 07/14/2017  . Essential hypertension 02/12/2016  . Multinodular goiter 02/12/2016  . Neck pain 02/12/2016  . Dental decay 02/12/2016  . Hyperlipidemia 01/23/2016    Past Surgical History:  Procedure Laterality Date  . NO PAST SURGERIES       OB History   No obstetric history on file.     Family History  Problem Relation Age of Onset  . Hearing loss Daughter        Deaf and mute  . Colon cancer Neg Hx     Social History   Tobacco Use  . Smoking status: Never Smoker  . Smokeless tobacco: Current User     Types: Chew  . Tobacco comment: Limited as not interested.  Discussed oral cancer   Vaping Use  . Vaping Use: Never used  Substance Use Topics  . Alcohol use: Yes    Alcohol/week: 0.0 standard drinks    Comment: Alcoholic drink twice yearly  . Drug use: No    Home Medications Prior to Admission medications   Medication Sig Start Date End Date Taking? Authorizing Provider  amLODipine (NORVASC) 5 MG tablet TAKE 1/2 TABLET BY MOUTH DAILY Patient not taking: Reported on 11/23/2018 11/26/17   Mack Hook, MD  atorvastatin (LIPITOR) 10 MG tablet TAKE 1 TABLET BY MOUTH EVERY EVENING WITH A MEAL Patient not taking: Reported on 11/23/2018 04/23/18   Mack Hook, MD  ibuprofen (ADVIL,MOTRIN) 600 MG tablet 1 tab by mouth twice daily with meal for 1 week, then only as needed. Patient not taking: Reported on 11/23/2018 09/20/17   Mack Hook, MD  levothyroxine (SYNTHROID, LEVOTHROID) 25 MCG tablet TAKE 1 TABLET(25 MCG) BY MOUTH DAILY BEFORE BREAKFAST Patient not taking: Reported on 11/23/2018 04/23/18   Mack Hook, MD    Allergies    Patient has no known allergies.  Review of Systems   Review of Systems  Cardiovascular: Positive for chest pain (right ribs).  All other systems reviewed and are negative.   Physical Exam Updated Vital Signs BP (!) 164/88 (  BP Location: Right Arm)   Pulse 79   Temp 98.2 F (36.8 C) (Oral)   Resp 16   SpO2 100%   Physical Exam Vitals and nursing note reviewed.  Constitutional:      Appearance: She is well-developed.  HENT:     Head: Normocephalic and atraumatic.  Eyes:     Conjunctiva/sclera: Conjunctivae normal.     Pupils: Pupils are equal, round, and reactive to light.  Cardiovascular:     Rate and Rhythm: Normal rate and regular rhythm.     Heart sounds: Normal heart sounds.  Pulmonary:     Effort: Pulmonary effort is normal. No respiratory distress.     Breath sounds: Normal breath sounds. No wheezing or rhonchi.      Comments: Lungs clear bilaterally, no distress noted Chest:       Comments: Tenderness as depicted above, no bruising or bony deformity noted, no crepitus, no flail segment Abdominal:     General: Bowel sounds are normal.     Palpations: Abdomen is soft.     Tenderness: There is no abdominal tenderness. There is no rebound.  Musculoskeletal:        General: Normal range of motion.     Cervical back: Normal range of motion.  Skin:    General: Skin is warm and dry.  Neurological:     Mental Status: She is alert and oriented to person, place, and time.     ED Results / Procedures / Treatments   Labs (all labs ordered are listed, but only abnormal results are displayed) Labs Reviewed - No data to display  EKG None  Radiology DG Ribs Unilateral W/Chest Right  Result Date: 04/22/2020 CLINICAL DATA:  Status post fall. EXAM: RIGHT RIBS AND CHEST - 3+ VIEW COMPARISON:  March 30, 2013 FINDINGS: No fracture or other bone lesions are seen involving the ribs. There is no evidence of pneumothorax or pleural effusion. Both lungs are clear. A predominant stable 4.1 cm x 2.6 cm lobulated calcified opacity is seen overlying the superior mediastinum on the left. The cardiac silhouette is within normal limits. IMPRESSION: 1. No acute or active cardiopulmonary disease. 2. No evidence of rib fracture or pneumothorax. 3. Stable lobulated calcified opacity within the superior mediastinum likely consistent with a calcified left thyroid mass. Electronically Signed   By: Virgina Norfolk M.D.   On: 04/22/2020 20:25    Procedures Procedures (including critical care time)  Medications Ordered in ED Medications  oxyCODONE-acetaminophen (PERCOCET/ROXICET) 5-325 MG per tablet 1 tablet (1 tablet Oral Given 04/22/20 2246)    ED Course  I have reviewed the triage vital signs and the nursing notes.  Pertinent labs & imaging results that were available during my care of the patient were reviewed by me and  considered in my medical decision making (see chart for details).    MDM Rules/Calculators/A&P  74 year old female presenting to the ED with right rib pain.  History provided by granddaughter at bedside as patient only speaks [place] and interpreter not available through our provided service.  She slipped on a plastic bag on the kitchen floor and ran into a nearby desk with her ribs.  There was no fall to the ground, head injury, loss of consciousness.  She has pain along the right anterior lower ribs.  There is no bruising or deformity noted on exam.  Rib films are negative for acute fracture or pneumothorax.  Does have noted findings of calcified left thyroid mass, this is known for  patient.  She remains hemodynamically stable here, oxygen saturations are stable on room air.  We will plan to discharge home with pain control and incentive spirometer, instructed on home use.  Close follow-up with PCP.  Return here for any new/acute changes.  Final Clinical Impression(s) / ED Diagnoses Final diagnoses:  Fall, initial encounter  Rib pain on right side    Rx / DC Orders ED Discharge Orders         Ordered    HYDROcodone-acetaminophen (NORCO/VICODIN) 5-325 MG tablet  Every 4 hours PRN     Discontinue  Reprint     04/23/20 0003           Larene Pickett, PA-C 04/23/20 3241    Dorie Rank, MD 04/23/20 [id number]

## 2020-04-22 NOTE — ED Triage Notes (Signed)
Pt reports she slipped on a plastic bag and hit the right ribs, hurts to take a deep breath around 7pm. Did not hit her head, no LOC. Pt speaks [place] and requires an interpreter.

## 2020-04-23 MED ORDER — HYDROCODONE-ACETAMINOPHEN 5-325 MG PO TABS: 1.0000 | ORAL_TABLET | ORAL | 0 refills | Status: DC | PRN

## 2020-04-23 NOTE — Discharge Instructions (Addendum)
Take the prescribed medication as directed.  Can take motrin with this if needed.  Continue using incentive spirometer as directed. Follow-up with your primary care doctor. Return to the ED for new or worsening symptoms.

## 2020-04-23 NOTE — ED Notes (Signed)
Patient verbalizes understanding of discharge instructions. Opportunity for questioning and answers were provided. Armband removed by staff, pt discharged from ED stable & ambulatory with family

## 2020-04-29 ENCOUNTER — Ambulatory Visit: Payer: Medicaid Other | Admitting: Internal Medicine

## 2020-05-01 ENCOUNTER — Encounter: Payer: Self-pay | Admitting: Internal Medicine

## 2020-05-01 ENCOUNTER — Ambulatory Visit (INDEPENDENT_AMBULATORY_CARE_PROVIDER_SITE_OTHER): Payer: Medicare Other | Admitting: Internal Medicine

## 2020-05-01 VITALS — BP 118/80 | HR 64 | Resp 12 | Ht 59.0 in | Wt 95.0 lb

## 2020-05-01 DIAGNOSIS — R0789 Other chest pain: Secondary | ICD-10-CM | POA: Diagnosis not present

## 2020-07-10 ENCOUNTER — Ambulatory Visit (INDEPENDENT_AMBULATORY_CARE_PROVIDER_SITE_OTHER): Payer: Medicare Other | Admitting: Internal Medicine

## 2020-07-10 ENCOUNTER — Encounter: Payer: Self-pay | Admitting: Internal Medicine

## 2020-07-10 VITALS — BP 118/70 | HR 76 | Resp 12 | Ht 59.0 in | Wt 97.0 lb

## 2020-07-10 DIAGNOSIS — Z1159 Encounter for screening for other viral diseases: Secondary | ICD-10-CM

## 2020-07-10 DIAGNOSIS — E042 Nontoxic multinodular goiter: Secondary | ICD-10-CM

## 2020-07-10 DIAGNOSIS — I10 Essential (primary) hypertension: Secondary | ICD-10-CM

## 2020-07-10 DIAGNOSIS — E782 Mixed hyperlipidemia: Secondary | ICD-10-CM

## 2020-07-10 DIAGNOSIS — Z1231 Encounter for screening mammogram for malignant neoplasm of breast: Secondary | ICD-10-CM | POA: Diagnosis not present

## 2020-07-10 DIAGNOSIS — K029 Dental caries, unspecified: Secondary | ICD-10-CM

## 2020-07-10 DIAGNOSIS — Z720 Tobacco use: Secondary | ICD-10-CM

## 2020-07-10 NOTE — Patient Instructions (Signed)
We are looking into SS benefit loss concern since citizenship Working on rehab of a bike for your husband

## 2020-07-10 NOTE — Progress Notes (Addendum)
    Subjective:    Patient ID: Julia Maldonado, female   DOB: [birth year], 74 y.o.   MRN: [id number]   HPI   Julia Maldonado, granddaughter, interprets.  1.  Right chest wall injury and pain:  No longer with any pain, even with deep breath.   2.  History of hypertension:  At one point we treated her with amlodipine for control, but she has not taken any likely in over a year and save for when in ED with her chest wall injury, her bp has been fine.  3.  Hyperlipidemia:  Also has not taken Atorvastatin in some time.  She is fasting today.  4.  Multinodular goiter:  Has not been taking Levothyroxine as well.  She feels she has good energy.  No difficulties with breathing or swallowing.  5.  Sessile colon polyp without dysplasia:  09/2017 colonscopy with Dr. Silverio Decamp.  To have repeat in 5 years (09/2022)  6.  Tobacco chew/nut combination:  Not interested in quitting.  Has not sought care for her teeth as well.   Medications:  None  No Known Allergies   Review of Systems    Objective:   BP 118/70 (BP Location: Left Arm, Patient Position: Sitting, Cuff Size: Normal)   Pulse 76   Resp 12   Ht 4\' 11"  (1.499 m)   Wt 97 lb (44 kg)   BMI 19.59 kg/m   Physical Exam  NAD HEENT:  PERRL, EOMI Throat without injection.  Teeth remaining are stained black from tobacco/nut chewing. Neck:  Supple, No adenopathyEnlarged and nodular Chest:  CTA CV:  RRR with normal S1 and S2, No S3, S4 or murmur.  No carotid bruit.  Carotid, radial and DP pulses normal and equal Abd:  S, NT, No HSM or mass, + BS LE:  No edema.   Assessment & Plan  1.  Chest wall injury:  Healed  2.  History of hypertension:  BP remains fine now she does not have pain. Follow.  CBC, CMP  3.  Hyperlipidemia:  FLP  4.  Multinodular goiter:   TSH  5.  HM:  Order mammogram.  Discussed will need repeat colonoscopy in 09/2022, Hepatitis C screening.  6.  SDOH:  No income currently.  Reportedly lost SS benefits once became citizens.  They are living with daughter as unable to afford housing and utilities otherwise.  [name], MSW intern contacting sources involved with refugees and citizenship and what happens with SS benefits along the way.  Frozen chicken packages given x 2.  7.  Dental decay and tobacco abuse:  Not interested in intervention with either currently.  Husband, [name], would still like a bike if we can find a refurbished one.

## 2020-07-10 NOTE — Progress Notes (Signed)
    Subjective:    Patient ID: Julia Maldonado, female   DOB: 01/10/1946, 74 y.o.   MRN: [id number]   HPI   This is a transcription of written record by Carolann Littler,  M.D. Please see written record in scanned in reports  04/22/20:  Slipped on plastic bag on kitchen floor, fell with R lower rib cage striking sharp corner of counter.  Rib Xrays/CXR (-) in ED.   Since then Getting gradually better.  No  SOB or new sx.  Took a few of the hydrocodone as prescribed- made her dizzy.  Now not taking anything.  Denies any new S's  No outpatient medications have been marked as taking for the 05/01/20 encounter (Office Visit) with Mack Hook, MD.   Current Facility-Administered Medications for the 05/01/20 encounter (Office Visit) with Mack Hook, MD  Medication  . 0.9 %  sodium chloride infusion   No Known Allergies   Review of Systems    Objective:   BP 118/80 (BP Location: Left Arm, Patient Position: Sitting, Cuff Size: Normal)   Pulse 64   Resp 12   Ht 4\' 11"  (1.499 m)   Wt 95 lb (43.1 kg)   BMI 19.19 kg/m   Physical Exam   Skin:  Nl  No bruises seen HEENT:  Nl Neck:  Nl Chest:  Mild point tenderness R mid ax.  Line --6th rib.  C/o pain anteriorly --5th -6th rib T side nipple line when takes a deep breath.  Lungs clear.  Gets up + down off exam table without difficulty. CV:  RRR Abd/Pelvis:  Soft/NT Extrems:  - LLE   Assessment & Plan   R chest wall contusion--can't R/O hairline fx of rib.   Will gradually improve + expect pain to completely subside within another 4-6 wks.  F/u if any increased pain or new sx.   Otherwise, recheck in 2 mos + f/u on htn (BP was high in ER) + preventive care.

## 2020-07-11 LAB — TSH: TSH: 0.737 u[IU]/mL (ref 0.450–4.500)

## 2020-07-11 LAB — CBC WITH DIFFERENTIAL/PLATELET
Basophils Absolute: 0 10*3/uL (ref 0.0–0.2)
Basos: 0 %
EOS (ABSOLUTE): 0.1 10*3/uL (ref 0.0–0.4)
Eos: 2 %
Hematocrit: 38.2 % (ref 34.0–46.6)
Hemoglobin: 12.9 g/dL (ref 11.1–15.9)
Immature Grans (Abs): 0 10*3/uL (ref 0.0–0.1)
Immature Granulocytes: 0 %
Lymphocytes Absolute: 1.8 10*3/uL (ref 0.7–3.1)
Lymphs: 30 %
MCH: 32.1 pg (ref 26.6–33.0)
MCHC: 33.8 g/dL (ref 31.5–35.7)
MCV: 95 fL (ref 79–97)
Monocytes Absolute: 0.7 10*3/uL (ref 0.1–0.9)
Monocytes: 11 %
Neutrophils Absolute: 3.5 10*3/uL (ref 1.4–7.0)
Neutrophils: 57 %
Platelets: 201 10*3/uL (ref 150–450)
RBC: 4.02 x10E6/uL (ref 3.77–5.28)
RDW: 12.9 % (ref 11.7–15.4)
WBC: 6.2 10*3/uL (ref 3.4–10.8)

## 2020-07-11 LAB — COMPREHENSIVE METABOLIC PANEL
ALT: 12 IU/L (ref 0–32)
AST: 21 IU/L (ref 0–40)
Albumin/Globulin Ratio: 1.1 — ABNORMAL LOW (ref 1.2–2.2)
Albumin: 4.1 g/dL (ref 3.7–4.7)
Alkaline Phosphatase: 114 IU/L (ref 44–121)
BUN/Creatinine Ratio: 11 — ABNORMAL LOW (ref 12–28)
BUN: 7 mg/dL — ABNORMAL LOW (ref 8–27)
Bilirubin Total: 0.4 mg/dL (ref 0.0–1.2)
CO2: 23 mmol/L (ref 20–29)
Calcium: 9.6 mg/dL (ref 8.7–10.3)
Chloride: 105 mmol/L (ref 96–106)
Creatinine, Ser: 0.62 mg/dL (ref 0.57–1.00)
GFR calc Af Amer: 103 mL/min/{1.73_m2} (ref >59)
GFR calc non Af Amer: 89 mL/min/{1.73_m2} (ref >59)
Globulin, Total: 3.9 g/dL (ref 1.5–4.5)
Glucose: 81 mg/dL (ref 65–99)
Potassium: 4.8 mmol/L (ref 3.5–5.2)
Sodium: 142 mmol/L (ref 134–144)
Total Protein: 8 g/dL (ref 6.0–8.5)

## 2020-07-11 LAB — HEPATITIS C ANTIBODY: Hep C Virus Ab: <0.1 s/co ratio (ref 0.0–0.9)

## 2020-07-11 LAB — LIPID PANEL W/O CHOL/HDL RATIO
Cholesterol, Total: 257 mg/dL — ABNORMAL HIGH (ref 100–199)
HDL: 51 mg/dL (ref >39)
LDL Chol Calc (NIH): 178 mg/dL — ABNORMAL HIGH (ref 0–99)
Triglycerides: 155 mg/dL — ABNORMAL HIGH (ref 0–149)
VLDL Cholesterol Cal: 28 mg/dL (ref 5–40)

## 2021-02-20 ENCOUNTER — Other Ambulatory Visit: Payer: Medicaid Other

## 2021-03-31 ENCOUNTER — Other Ambulatory Visit (INDEPENDENT_AMBULATORY_CARE_PROVIDER_SITE_OTHER): Payer: Medicare Other | Admitting: Internal Medicine

## 2021-03-31 ENCOUNTER — Other Ambulatory Visit: Payer: Self-pay

## 2021-03-31 DIAGNOSIS — E782 Mixed hyperlipidemia: Secondary | ICD-10-CM

## 2021-04-01 LAB — LIPID PANEL W/O CHOL/HDL RATIO
Cholesterol, Total: 269 mg/dL — ABNORMAL HIGH (ref 100–199)
HDL: 47 mg/dL (ref >39)
LDL Chol Calc (NIH): 197 mg/dL — ABNORMAL HIGH (ref 0–99)
Triglycerides: 138 mg/dL (ref 0–149)
VLDL Cholesterol Cal: 25 mg/dL (ref 5–40)

## 2021-05-26 ENCOUNTER — Other Ambulatory Visit: Payer: Self-pay | Admitting: Internal Medicine

## 2021-05-26 DIAGNOSIS — E782 Mixed hyperlipidemia: Secondary | ICD-10-CM

## 2021-05-26 MED ORDER — ATORVASTATIN CALCIUM 10 MG PO TABS: ORAL_TABLET | ORAL | 11 refills | Status: DC

## 2021-07-16 ENCOUNTER — Encounter: Payer: Medicaid Other | Admitting: Internal Medicine

## 2021-07-31 ENCOUNTER — Other Ambulatory Visit: Payer: Medicare Other

## 2021-08-25 ENCOUNTER — Encounter: Payer: Self-pay | Admitting: Internal Medicine

## 2021-08-25 ENCOUNTER — Other Ambulatory Visit: Payer: Self-pay

## 2021-08-25 ENCOUNTER — Ambulatory Visit: Payer: Medicare Other | Admitting: Internal Medicine

## 2021-08-25 VITALS — BP 102/72 | HR 76 | Resp 20 | Ht 59.5 in | Wt 99.0 lb

## 2021-08-25 DIAGNOSIS — E782 Mixed hyperlipidemia: Secondary | ICD-10-CM

## 2021-08-25 DIAGNOSIS — Z1231 Encounter for screening mammogram for malignant neoplasm of breast: Secondary | ICD-10-CM

## 2021-08-25 DIAGNOSIS — H269 Unspecified cataract: Secondary | ICD-10-CM

## 2021-08-25 DIAGNOSIS — Z0000 Encounter for general adult medical examination without abnormal findings: Secondary | ICD-10-CM

## 2021-08-25 DIAGNOSIS — Z23 Encounter for immunization: Secondary | ICD-10-CM

## 2021-08-25 DIAGNOSIS — E042 Nontoxic multinodular goiter: Secondary | ICD-10-CM

## 2021-08-25 DIAGNOSIS — Z780 Asymptomatic menopausal state: Secondary | ICD-10-CM

## 2021-08-25 DIAGNOSIS — Z79899 Other long term (current) drug therapy: Secondary | ICD-10-CM

## 2021-08-25 NOTE — Progress Notes (Signed)
 Subjective:    Patient ID: Julia Maldonado, female   DOB: 28-Jun-1946, 75 y.o.   MRN: [id number]   HPI  CPE without pap  1.  Pap:  Never.  Long term marriage.  No new partners.  She may have had a pap smear when first came to [place]    2.  Mammogram:  Last was 2018 and normal.  No family history of breast cancer.    3.  Osteoprevention:  Maybe once serving of dairy daily.  Does not want to take vitamin.  Willing to drink 3 cups milk daily.  Walks regularly.  Maybe about 1.5 miles daily.    4.  Guaiac Cards/FIT:  Positive in 2018, leading to colonoscopy with finding of adenomatous polyp 09/2017.  5.  Colonoscopy:  Repeat in 09/2022 for adenomatous polyp.  Dr. Nandigam.    6.  Immunizations:  Has not had influenza vaccine this year.  No history of shingles vaccine.    7.  Glucose/Cholesterol :  Has not had bloodwork for cholesterol since 03/2021.  Cholesterol quite high then.  No history of elevated glucose      Current Meds  Medication Sig   atorvastatin  (LIPITOR) 10 MG tablet TAKE 1 TABLET BY MOUTH EVERY EVENING WITH A MEAL   No Known Allergies  Past Medical History:  Diagnosis Date   Bilateral cataracts 07/14/2017   Dental decay    Hyperlipidemia    on Atorvastatin    Hypertension ?2014   Multinodular goiter 02/12/2016   Neck pain 02/12/2016   Past Surgical History:  Procedure Laterality Date   NO PAST SURGERIES     Family History  Problem Relation Age of Onset   Hearing loss Daughter        Deaf and mute   Colon cancer Neg Hx    Breast cancer Neg Hx    Social History   Socioeconomic History   Marital status: Married    Spouse name: [name]   Number of children: 7   Years of education: 0   Highest education level: Not on file  Occupational History   Occupation: None  Tobacco Use   Smoking status: Never   Smokeless tobacco: Current    Types: Chew   Tobacco comments:    Limited as not interested.  Discussed oral cancer   Vaping Use   Vaping status: Never Used   Substance and Sexual Activity   Alcohol use: Yes    Comment: Occasional.   Drug use: No   Sexual activity: Yes  Other Topics Concern   Not on file  Social History Narrative   Originally from [place]   [place] in [place] years   [place]ame in [place] to [place]   January 2016 moved from [place] to [place].   Moved to rental home near [place] in 2017   Lives with husband, youngest daughter, [name]   Social Drivers of Health   Financial Resource Strain: Low Risk  (08/25/2021)   Overall Financial Resource Strain (CARDIA)    Difficulty of Paying Living Expenses: Not hard at all  Food Insecurity: No Food Insecurity (08/25/2021)   Hunger Vital Sign    Worried About Running Out of Food in the Last Year: Never true    Ran Out of Food in the Last Year: Never true  Transportation Needs: No Transportation Needs (08/25/2021)   PRAPARE - Administrator, Civil Service (Medical): No    Lack of Transportation (Non-Medical):  No  Physical Activity: Not on file  Stress: Not on file  Social Connections: Not on file  Intimate Partner Violence: Not At Risk (08/25/2021)   Humiliation, Afraid, Rape, and Kick questionnaire    Fear of Current or Ex-Partner: No    Emotionally Abused: No    Physically Abused: No    Sexually Abused: No      Review of Systems  Eyes:  Negative for visual disturbance.  Respiratory:  Negative for shortness of breath.   Cardiovascular:  Negative for chest pain, palpitations and leg swelling.  Musculoskeletal:        Upper thoracic back pain--hurts in middle.  No definite history of injury.  History of a fall years ago.     Objective:   BP 102/72 (BP Location: Right Arm, Patient Position: Sitting, Cuff Size: Normal)   Pulse 76   Resp 20   Ht 4' 11.5" (1.511 m)   Wt 99 lb (44.9 kg)   BMI 19.66 kg/m   Physical Exam Constitutional:      Appearance: Normal appearance.  HENT:     Head: Normocephalic and atraumatic.     Right Ear:  Tympanic membrane, ear canal and external ear normal.     Left Ear: Tympanic membrane, ear canal and external ear normal.     Nose: Nose normal.     Mouth/Throat:     Mouth: Mucous membranes are moist.     Pharynx: Oropharynx is clear.     Comments: Many missing teeth and remaining ones quite worn and blackened. Eyes:     Extraocular Movements: Extraocular movements intact.     Conjunctiva/sclera: Conjunctivae normal.     Pupils: Pupils are equal, round, and reactive to light.     Comments: Bilateral cataracts obstruct view of discs  Neck:     Thyroid : Thyromegaly (nodular) present.  Cardiovascular:     Heart sounds: S1 normal and S2 normal. No murmur heard.    No friction rub. No S3 or S4 sounds.     Comments: No carotid bruits.  Carotid, radial, femoral, DP and PT pulses normal and equal.   Pulmonary:     Effort: Pulmonary effort is normal.     Breath sounds: Normal breath sounds and air entry.  Chest:  Breasts:    Right: No inverted nipple, mass or nipple discharge.     Left: No inverted nipple, mass or nipple discharge.  Abdominal:     General: Bowel sounds are normal.     Palpations: Abdomen is soft. There is no hepatomegaly, splenomegaly or mass.     Tenderness: There is no abdominal tenderness.  Genitourinary:    Comments: Declined exam Musculoskeletal:     Cervical back: Normal range of motion and neck supple.     Right lower leg: No edema.     Left lower leg: No edema.  Lymphadenopathy:     Head:     Right side of head: No submental or submandibular adenopathy.     Left side of head: No submental or submandibular adenopathy.     Cervical: No cervical adenopathy.     Upper Body:     Right upper body: No supraclavicular or axillary adenopathy.     Left upper body: No supraclavicular or axillary adenopathy.     Lower Body: No right inguinal adenopathy. No left inguinal adenopathy.  Skin:    General: Skin is warm and dry.     Capillary Refill: Capillary refill  takes less than 2 seconds.  Neurological:     General: No focal deficit present.     Mental Status: She is alert and oriented to person, place, and time.     Cranial Nerves: Cranial nerves 2-12 are intact.     Sensory: Sensation is intact.     Motor: Motor function is intact.     Coordination: Coordination is intact.     Gait: Gait is intact.     Deep Tendon Reflexes: Reflexes are normal and symmetric.  Psychiatric:        Speech: Speech normal.        Behavior: Behavior normal. Behavior is cooperative.      Assessment & Plan    CPE without pap Mammogram Pfizer Bivalent COVID vaccine Influenza vaccine FIT to return in 2 weeks. DEXA Bone density CBC, CMP  2.  Hyperlipidemia:  FLP  3.  Multinodular Goiter:  TSH.  FNA of 3 nodules in 2017 with benign follicular tissue.

## 2021-08-26 LAB — CBC WITH DIFFERENTIAL/PLATELET
Basophils Absolute: 0 10*3/uL (ref 0.0–0.2)
Basos: 0 %
EOS (ABSOLUTE): 0.1 10*3/uL (ref 0.0–0.4)
Eos: 1 %
Hematocrit: 39.2 % (ref 34.0–46.6)
Hemoglobin: 13.4 g/dL (ref 11.1–15.9)
Immature Grans (Abs): 0 10*3/uL (ref 0.0–0.1)
Immature Granulocytes: 0 %
Lymphocytes Absolute: 2.6 10*3/uL (ref 0.7–3.1)
Lymphs: 25 %
MCH: 31.4 pg (ref 26.6–33.0)
MCHC: 34.2 g/dL (ref 31.5–35.7)
MCV: 92 fL (ref 79–97)
Monocytes Absolute: 0.8 10*3/uL (ref 0.1–0.9)
Monocytes: 8 %
Neutrophils Absolute: 6.6 10*3/uL (ref 1.4–7.0)
Neutrophils: 66 %
Platelets: 178 10*3/uL (ref 150–450)
RBC: 4.27 x10E6/uL (ref 3.77–5.28)
RDW: 13 % (ref 11.7–15.4)
WBC: 10.2 10*3/uL (ref 3.4–10.8)

## 2021-08-26 LAB — LIPID PANEL W/O CHOL/HDL RATIO
Cholesterol, Total: 181 mg/dL (ref 100–199)
HDL: 51 mg/dL (ref >39)
LDL Chol Calc (NIH): 112 mg/dL — ABNORMAL HIGH (ref 0–99)
Triglycerides: 98 mg/dL (ref 0–149)
VLDL Cholesterol Cal: 18 mg/dL (ref 5–40)

## 2021-08-26 LAB — COMPREHENSIVE METABOLIC PANEL
ALT: 15 IU/L (ref 0–32)
AST: 24 IU/L (ref 0–40)
Albumin/Globulin Ratio: 1.2 (ref 1.2–2.2)
Albumin: 4.3 g/dL (ref 3.7–4.7)
Alkaline Phosphatase: 111 IU/L (ref 44–121)
BUN/Creatinine Ratio: 14 (ref 12–28)
BUN: 11 mg/dL (ref 8–27)
Bilirubin Total: 0.5 mg/dL (ref 0.0–1.2)
CO2: 21 mmol/L (ref 20–29)
Calcium: 9.4 mg/dL (ref 8.7–10.3)
Chloride: 103 mmol/L (ref 96–106)
Creatinine, Ser: 0.8 mg/dL (ref 0.57–1.00)
Globulin, Total: 3.5 g/dL (ref 1.5–4.5)
Glucose: 90 mg/dL (ref 70–99)
Potassium: 4 mmol/L (ref 3.5–5.2)
Sodium: 139 mmol/L (ref 134–144)
Total Protein: 7.8 g/dL (ref 6.0–8.5)
eGFR: 77 mL/min/{1.73_m2} (ref >59)

## 2021-08-26 LAB — TSH: TSH: 0.482 u[IU]/mL (ref 0.450–4.500)

## 2021-10-24 ENCOUNTER — Ambulatory Visit
Admission: RE | Admit: 2021-10-24 | Discharge: 2021-10-24 | Disposition: A | Discharge status: Home / Self Care | Payer: Medicare Other | Source: Ambulatory Visit | Attending: Internal Medicine | Admitting: Internal Medicine

## 2021-10-24 DIAGNOSIS — Z1231 Encounter for screening mammogram for malignant neoplasm of breast: Secondary | ICD-10-CM

## 2021-10-28 ENCOUNTER — Other Ambulatory Visit: Payer: Self-pay

## 2021-10-28 ENCOUNTER — Ambulatory Visit (INDEPENDENT_AMBULATORY_CARE_PROVIDER_SITE_OTHER): Payer: Medicare Other | Admitting: Internal Medicine

## 2021-10-28 DIAGNOSIS — Z23 Encounter for immunization: Secondary | ICD-10-CM | POA: Diagnosis not present

## 2022-01-05 ENCOUNTER — Other Ambulatory Visit: Payer: Self-pay | Admitting: Internal Medicine

## 2022-01-05 DIAGNOSIS — E782 Mixed hyperlipidemia: Secondary | ICD-10-CM

## 2022-01-26 ENCOUNTER — Ambulatory Visit: Payer: Medicare Other | Admitting: Internal Medicine

## 2022-02-09 ENCOUNTER — Ambulatory Visit: Payer: Medicare Other | Admitting: Internal Medicine

## 2022-02-11 ENCOUNTER — Other Ambulatory Visit: Payer: Self-pay | Admitting: Internal Medicine

## 2022-02-11 DIAGNOSIS — E2839 Other primary ovarian failure: Secondary | ICD-10-CM

## 2022-02-11 DIAGNOSIS — Z780 Asymptomatic menopausal state: Secondary | ICD-10-CM

## 2022-02-12 ENCOUNTER — Other Ambulatory Visit: Payer: Medicaid Other

## 2022-02-13 ENCOUNTER — Other Ambulatory Visit: Payer: Medicaid Other

## 2022-03-25 IMAGING — MG MM DIGITAL SCREENING BILAT W/ TOMO AND CAD
8 series · 9 of 24 positions shown · non-contrast
Comparison: Previous exam(s).

CLINICAL DATA: Screening.

EXAM:
DIGITAL SCREENING BILATERAL MAMMOGRAM WITH TOMOSYNTHESIS AND CAD
TECHNIQUE: Bilateral screening digital craniocaudal and mediolateral oblique
mammograms were obtained. Bilateral screening digital breast
tomosynthesis was performed. The images were evaluated with
computer-aided detection.

[R CC synth-2D]
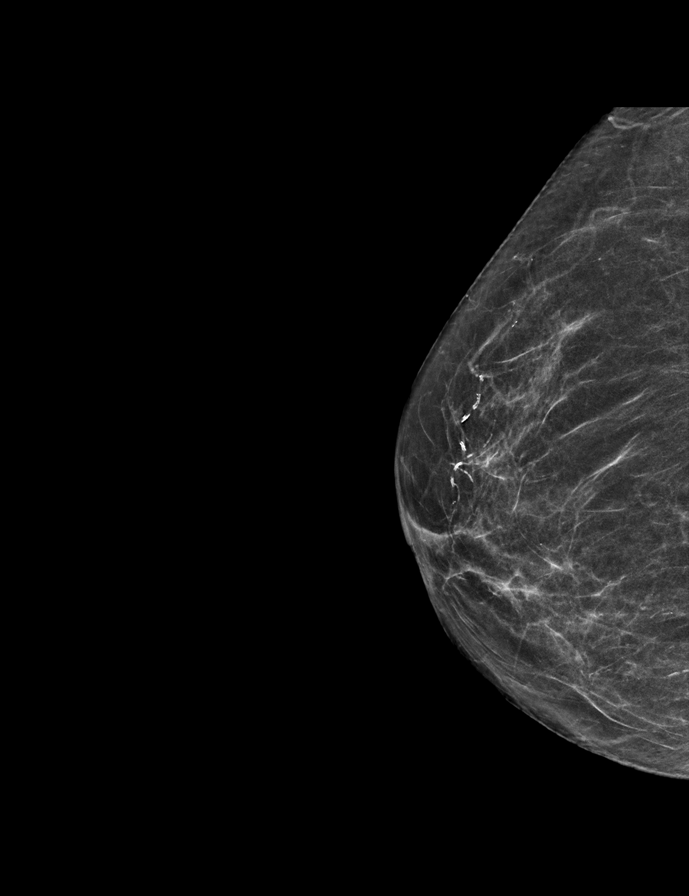

[L CC synth-2D]
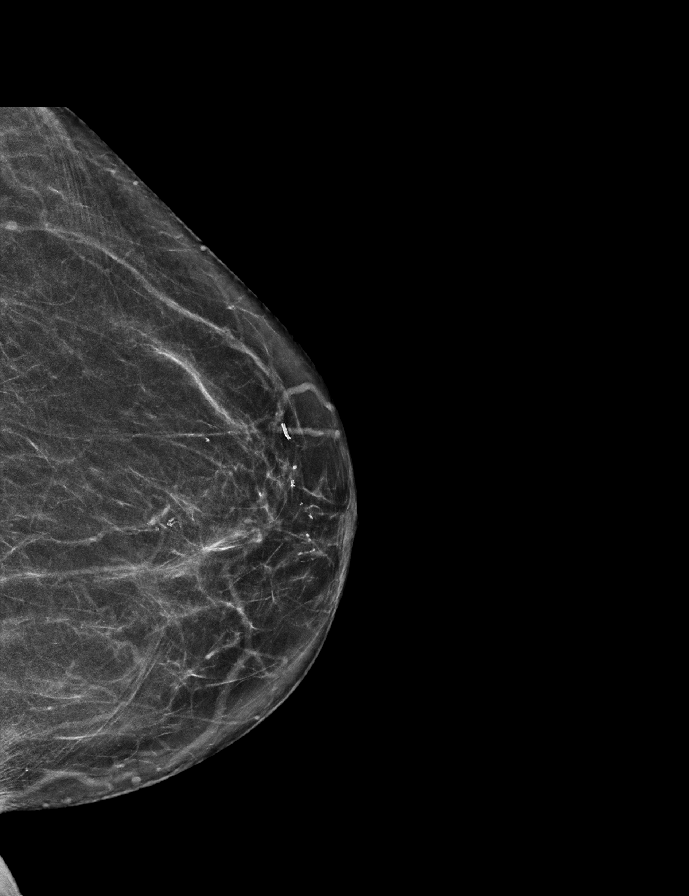

[L MLO synth-2D]
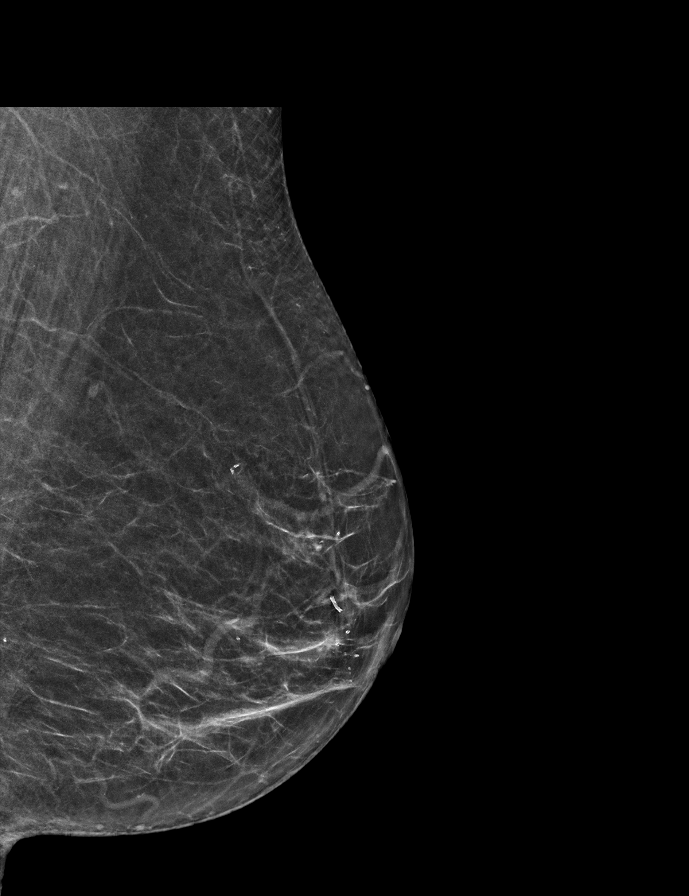

[R MLO synth-2D]
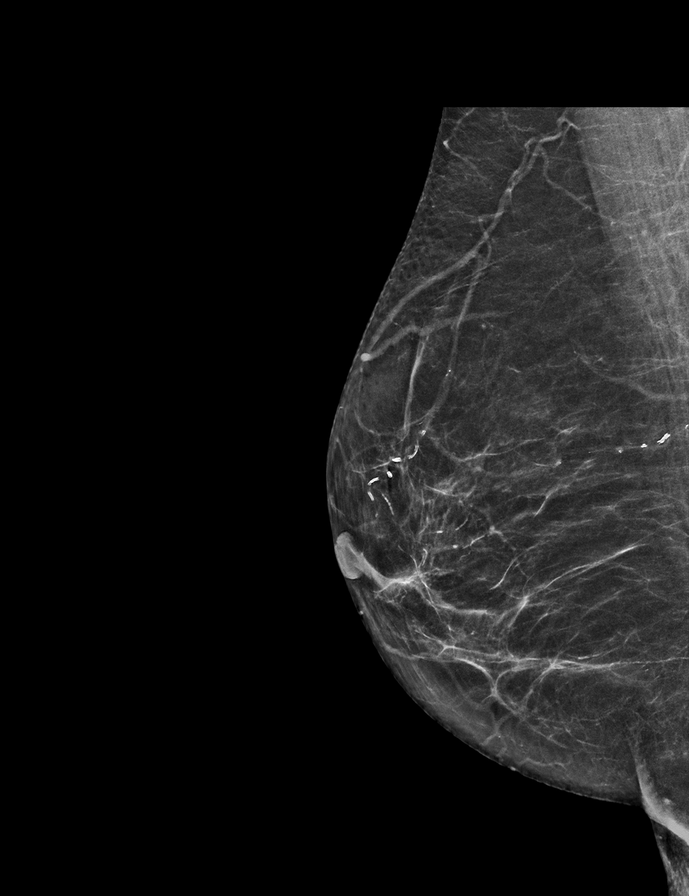

[L MLO tomo · 2 of 52 frames shown]
[frame 17/52]
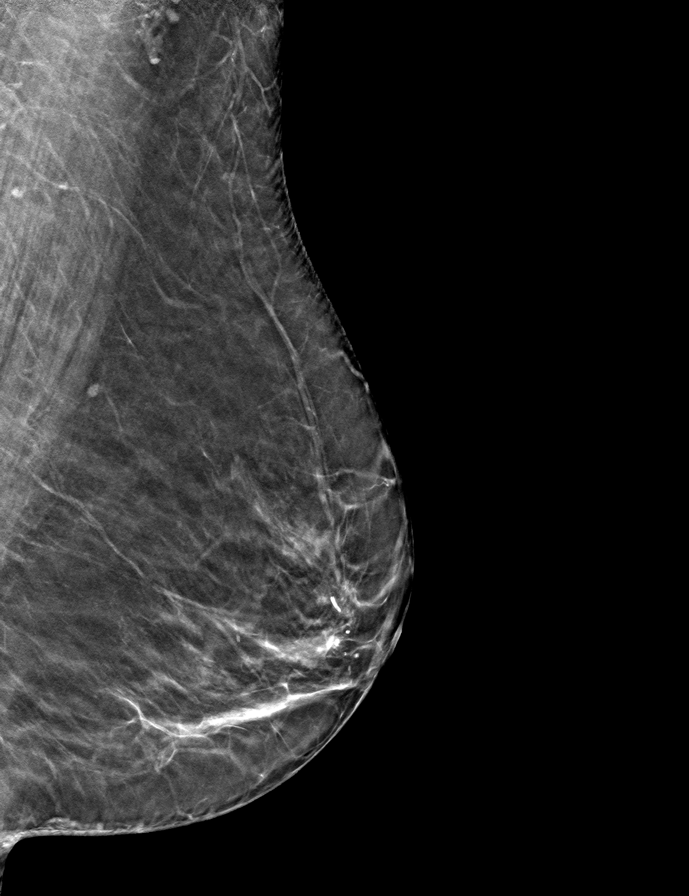
[frame 27/52]
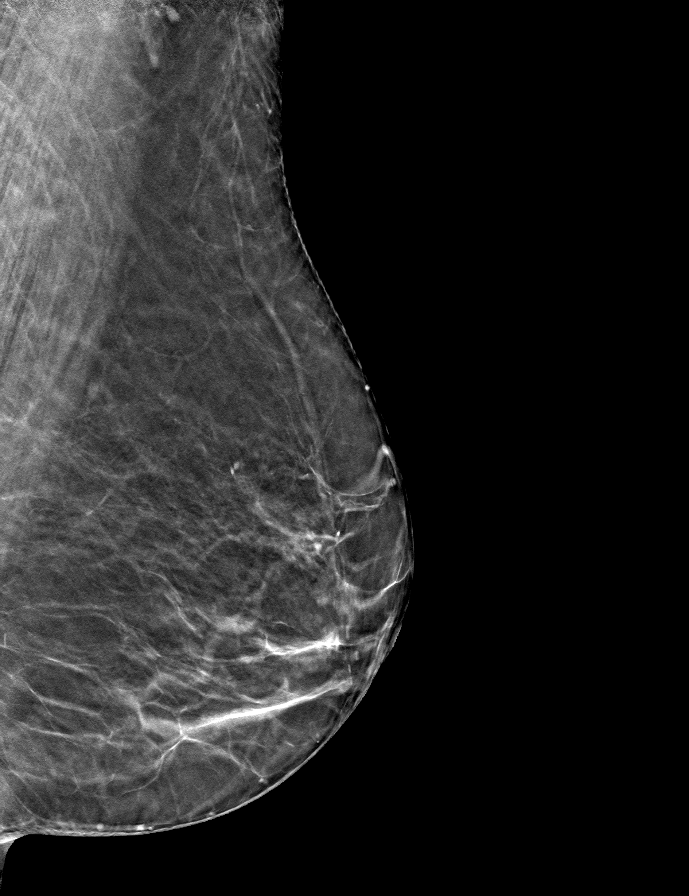

[L CC tomo · tomo slice 28/55.0]
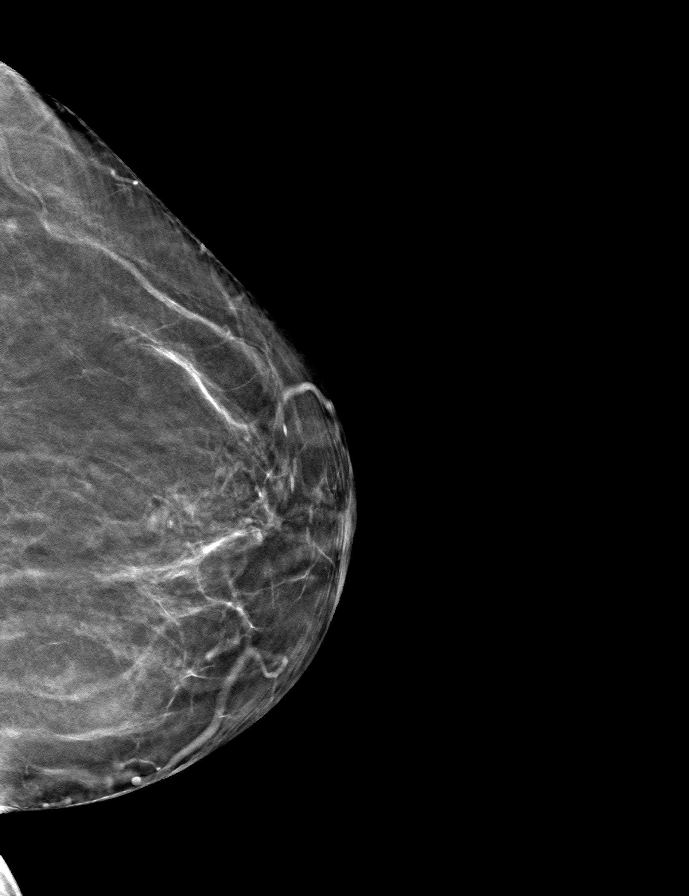

[R MLO tomo · tomo slice 27/53.0]
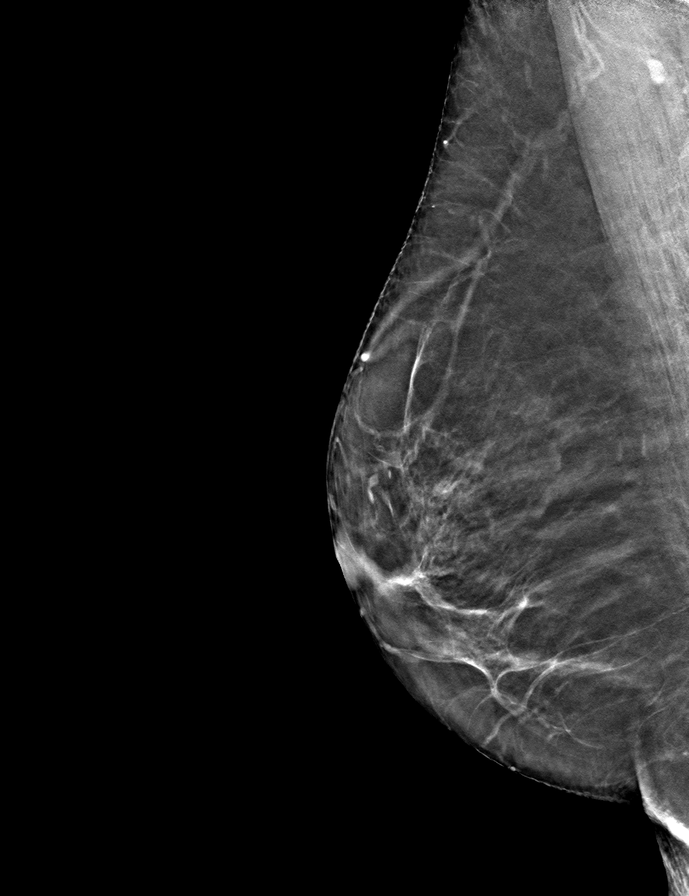

[R CC tomo · tomo slice 27/52.0]
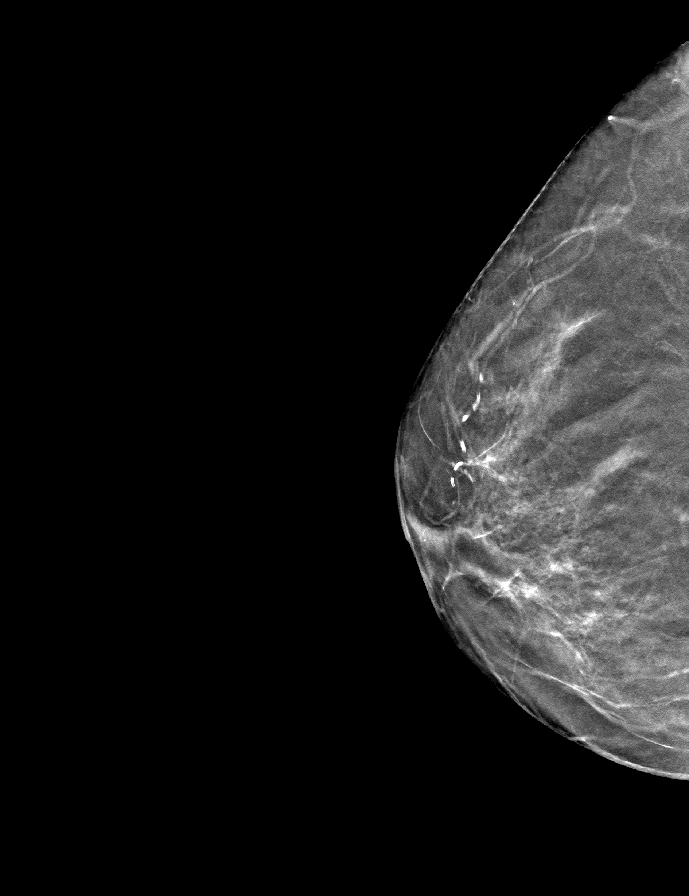

[9 of 24 positions shown; findings below may reference images not displayed]

ACR Breast Density Category b: There are scattered areas of
fibroglandular density.
FINDINGS: There are no findings suspicious for malignancy.
IMPRESSION: No mammographic evidence of malignancy. A result letter of this
screening mammogram will be mailed directly to the patient.

RECOMMENDATION:
Screening mammogram in one year. (Code:51-O-LD2)

BI-RADS CATEGORY  1: Negative.

## 2022-08-06 ENCOUNTER — Ambulatory Visit
Admission: RE | Admit: 2022-08-06 | Discharge: 2022-08-06 | Disposition: A | Discharge status: Home / Self Care | Payer: Medicare Other | Source: Ambulatory Visit | Attending: Internal Medicine | Admitting: Internal Medicine

## 2022-08-06 DIAGNOSIS — E2839 Other primary ovarian failure: Secondary | ICD-10-CM

## 2022-12-18 ENCOUNTER — Other Ambulatory Visit: Payer: Self-pay | Admitting: Internal Medicine

## 2022-12-18 DIAGNOSIS — E782 Mixed hyperlipidemia: Secondary | ICD-10-CM

## 2023-06-01 ENCOUNTER — Telehealth: Payer: Self-pay

## 2023-06-01 NOTE — Telephone Encounter (Signed)
Patient will like to be on waiting list for a sooner appointment than her scheduled appointment in November, patient has bodyache has no cholesterol medication.   Will call patient if there is cancellations.

## 2023-06-16 NOTE — Telephone Encounter (Signed)
Patient has been scheduled

## 2023-06-18 ENCOUNTER — Ambulatory Visit (INDEPENDENT_AMBULATORY_CARE_PROVIDER_SITE_OTHER): Payer: Medicare Other | Admitting: Internal Medicine

## 2023-06-18 ENCOUNTER — Ambulatory Visit
Admission: RE | Admit: 2023-06-18 | Discharge: 2023-06-18 | Disposition: A | Discharge status: Home / Self Care | Payer: Medicaid Other | Source: Ambulatory Visit | Attending: Internal Medicine | Admitting: Internal Medicine

## 2023-06-18 ENCOUNTER — Encounter: Payer: Self-pay | Admitting: Internal Medicine

## 2023-06-18 VITALS — BP 142/80 | HR 68 | Resp 16 | Ht 59.25 in | Wt 98.5 lb

## 2023-06-18 DIAGNOSIS — G5622 Lesion of ulnar nerve, left upper limb: Secondary | ICD-10-CM

## 2023-06-18 DIAGNOSIS — M542 Cervicalgia: Secondary | ICD-10-CM

## 2023-06-18 DIAGNOSIS — G5602 Carpal tunnel syndrome, left upper limb: Secondary | ICD-10-CM | POA: Diagnosis not present

## 2023-06-18 DIAGNOSIS — E782 Mixed hyperlipidemia: Secondary | ICD-10-CM

## 2023-06-18 DIAGNOSIS — Z23 Encounter for immunization: Secondary | ICD-10-CM | POA: Diagnosis not present

## 2023-06-18 DIAGNOSIS — Z1231 Encounter for screening mammogram for malignant neoplasm of breast: Secondary | ICD-10-CM

## 2023-06-18 DIAGNOSIS — M7712 Lateral epicondylitis, left elbow: Secondary | ICD-10-CM

## 2023-06-18 DIAGNOSIS — R030 Elevated blood-pressure reading, without diagnosis of hypertension: Secondary | ICD-10-CM

## 2023-06-18 MED ORDER — ATORVASTATIN CALCIUM 10 MG PO TABS: ORAL_TABLET | ORAL | 3 refills | Status: DC

## 2023-06-18 MED ORDER — IBUPROFEN 200 MG PO TABS: ORAL_TABLET | ORAL | 0 refills | Status: AC

## 2023-06-18 NOTE — Patient Instructions (Signed)
[facility] or [facility]  [address] Cock up splint for left hand to wear every night--med supply or pharmacy Elbow pad for inside cushion of elbow to wear at [employer] or medical supply

## 2023-06-18 NOTE — Progress Notes (Unsigned)
    Subjective:    Patient ID: Julia Maldonado, female   DOB: September 21, 1946, 77 y.o.   MRN: [id number]   HPI  [name], Granddaughter, interprets   Left shoulder/ lateral scapular pain:  Started 1-2 months ago.  No history of injury.  Just awakened with it one day.  Has been a consistent pain and has not changed in time.  Pain starts in muscles lateral to scapula and radiates down anterior upper arm with most of pain in posterior elbow and then radial forearm.  In the morning she awakens with all of her fingers hurting.  Her hand feels heavy and gets pain--not clear if pins and needles--when she shakes her hands and arm.  Can awaken her from sleep.  States she feels better if sleeps on the left arm.   Prior to the pain starting, she had used the left arm to carry her 30 lb granddaughter a lot.  Now unable to.  No outpatient medications have been marked as taking for the 06/18/23 encounter (Office Visit) with Julieanne Manson, MD.   No Known Allergies   Review of Systems    Objective:   BP (!) 142/80 (BP Location: Right Arm, Patient Position: Sitting, Cuff Size: Normal)   Pulse 68   Resp 16   Ht 4' 11.25" (1.505 m)   Wt 98 lb 8 oz (44.7 kg)   BMI 19.73 kg/m   Physical Exam   Assessment & Plan

## 2023-07-09 ENCOUNTER — Ambulatory Visit: Payer: Medicare Other | Admitting: Internal Medicine

## 2023-07-09 DIAGNOSIS — Z23 Encounter for immunization: Secondary | ICD-10-CM

## 2023-08-27 ENCOUNTER — Ambulatory Visit
Admission: RE | Admit: 2023-08-27 | Discharge: 2023-08-27 | Disposition: A | Discharge status: Home / Self Care | Payer: Medicare Other | Source: Ambulatory Visit | Attending: Internal Medicine | Admitting: Internal Medicine

## 2023-08-27 DIAGNOSIS — Z1231 Encounter for screening mammogram for malignant neoplasm of breast: Secondary | ICD-10-CM

## 2023-09-14 ENCOUNTER — Ambulatory Visit: Payer: Medicare Other | Admitting: Internal Medicine

## 2023-10-15 ENCOUNTER — Other Ambulatory Visit: Payer: Medicare Other

## 2023-10-21 ENCOUNTER — Ambulatory Visit: Payer: Medicare Other | Admitting: Internal Medicine

## 2023-11-05 ENCOUNTER — Other Ambulatory Visit: Payer: Medicare Other

## 2023-11-05 DIAGNOSIS — E782 Mixed hyperlipidemia: Secondary | ICD-10-CM

## 2023-11-05 DIAGNOSIS — Z79899 Other long term (current) drug therapy: Secondary | ICD-10-CM

## 2023-11-06 LAB — CBC WITH DIFFERENTIAL/PLATELET
Basophils Absolute: 0 10*3/uL (ref 0.0–0.2)
Basos: 0 %
EOS (ABSOLUTE): 0.1 10*3/uL (ref 0.0–0.4)
Eos: 2 %
Hematocrit: 40.2 % (ref 34.0–46.6)
Hemoglobin: 13.2 g/dL (ref 11.1–15.9)
Immature Grans (Abs): 0 10*3/uL (ref 0.0–0.1)
Immature Granulocytes: 0 %
Lymphocytes Absolute: 2.2 10*3/uL (ref 0.7–3.1)
Lymphs: 35 %
MCH: 31.4 pg (ref 26.6–33.0)
MCHC: 32.8 g/dL (ref 31.5–35.7)
MCV: 96 fL (ref 79–97)
Monocytes Absolute: 0.6 10*3/uL (ref 0.1–0.9)
Monocytes: 10 %
Neutrophils Absolute: 3.4 10*3/uL (ref 1.4–7.0)
Neutrophils: 53 %
Platelets: 157 10*3/uL (ref 150–450)
RBC: 4.21 x10E6/uL (ref 3.77–5.28)
RDW: 12.7 % (ref 11.7–15.4)
WBC: 6.5 10*3/uL (ref 3.4–10.8)

## 2023-11-06 LAB — COMPREHENSIVE METABOLIC PANEL
ALT: 12 [IU]/L (ref 0–32)
AST: 17 [IU]/L (ref 0–40)
Albumin: 4.3 g/dL (ref 3.8–4.8)
Alkaline Phosphatase: 107 [IU]/L (ref 44–121)
BUN/Creatinine Ratio: 15 (ref 12–28)
BUN: 10 mg/dL (ref 8–27)
Bilirubin Total: 0.6 mg/dL (ref 0.0–1.2)
CO2: 24 mmol/L (ref 20–29)
Calcium: 9.2 mg/dL (ref 8.7–10.3)
Chloride: 106 mmol/L (ref 96–106)
Creatinine, Ser: 0.67 mg/dL (ref 0.57–1.00)
Globulin, Total: 2.9 g/dL (ref 1.5–4.5)
Glucose: 87 mg/dL (ref 70–99)
Potassium: 3.7 mmol/L (ref 3.5–5.2)
Sodium: 144 mmol/L (ref 134–144)
Total Protein: 7.2 g/dL (ref 6.0–8.5)
eGFR: 89 mL/min/{1.73_m2} (ref >59)

## 2023-11-06 LAB — LIPID PANEL W/O CHOL/HDL RATIO
Cholesterol, Total: 177 mg/dL (ref 100–199)
HDL: 41 mg/dL (ref >39)
LDL Chol Calc (NIH): 109 mg/dL — ABNORMAL HIGH (ref 0–99)
Triglycerides: 155 mg/dL — ABNORMAL HIGH (ref 0–149)
VLDL Cholesterol Cal: 27 mg/dL (ref 5–40)

## 2023-11-11 ENCOUNTER — Ambulatory Visit: Payer: Medicare Other | Admitting: Internal Medicine

## 2023-12-02 ENCOUNTER — Telehealth: Payer: Self-pay

## 2023-12-02 DIAGNOSIS — M7712 Lateral epicondylitis, left elbow: Secondary | ICD-10-CM

## 2023-12-02 DIAGNOSIS — M549 Dorsalgia, unspecified: Secondary | ICD-10-CM

## 2023-12-02 DIAGNOSIS — G5602 Carpal tunnel syndrome, left upper limb: Secondary | ICD-10-CM

## 2023-12-02 DIAGNOSIS — G5622 Lesion of ulnar nerve, left upper limb: Secondary | ICD-10-CM

## 2023-12-02 DIAGNOSIS — M542 Cervicalgia: Secondary | ICD-10-CM

## 2023-12-02 NOTE — Telephone Encounter (Signed)
Patient does not have transportation to get to PT appointments. [facility] office suggested home health PT as an option.

## 2023-12-16 NOTE — Telephone Encounter (Signed)
 [facility] does not do home PT. They only do out patient. They suggested that we could try sending order to [facility].

## 2023-12-16 NOTE — Telephone Encounter (Signed)
 Reordered--not clear due to abbreviations that am using the correct order

## 2023-12-20 NOTE — Telephone Encounter (Signed)
 Referral has been sent.

## 2024-02-23 ENCOUNTER — Ambulatory Visit (INDEPENDENT_AMBULATORY_CARE_PROVIDER_SITE_OTHER): Payer: Medicare Other | Admitting: Internal Medicine

## 2024-02-23 ENCOUNTER — Encounter: Payer: Self-pay | Admitting: Internal Medicine

## 2024-02-23 VITALS — BP 130/84 | HR 76 | Resp 16 | Ht 59.25 in | Wt 98.0 lb

## 2024-02-23 DIAGNOSIS — D126 Benign neoplasm of colon, unspecified: Secondary | ICD-10-CM | POA: Insufficient documentation

## 2024-02-23 DIAGNOSIS — G5622 Lesion of ulnar nerve, left upper limb: Secondary | ICD-10-CM | POA: Diagnosis not present

## 2024-02-23 DIAGNOSIS — E782 Mixed hyperlipidemia: Secondary | ICD-10-CM

## 2024-02-23 DIAGNOSIS — M7712 Lateral epicondylitis, left elbow: Secondary | ICD-10-CM

## 2024-02-23 DIAGNOSIS — M542 Cervicalgia: Secondary | ICD-10-CM

## 2024-02-23 DIAGNOSIS — G5601 Carpal tunnel syndrome, right upper limb: Secondary | ICD-10-CM | POA: Diagnosis not present

## 2024-02-23 DIAGNOSIS — G5602 Carpal tunnel syndrome, left upper limb: Secondary | ICD-10-CM

## 2024-02-23 NOTE — Progress Notes (Signed)
    Subjective:    Patient ID: Julia Maldonado, female   DOB: 09/18/1946, 78 y.o.   MRN: [id number]   HPI  Daughter, [name], interprets   Left lateral epicondylitis/ulnar neuropathy, left shoulder and neck pain:  Never obtained pads or splints.  Xray with mild DDD of cervical spine.  She states she is no longer having the symptoms.  2.  Right hand:  difficulty explaining, but seems she is describing numbness and tingling of right hand, especially in the morning.  Puts under hot water and comes back.  No weakness of hand.    3.  Hyperlipidemia:  Cholesterol fair on low dose statin in January.  She is working on diet and physical activity     Current Meds  Medication Sig   atorvastatin  (LIPITOR) 10 MG tablet TAKE 1 TABLET BY MOUTH EVERY EVENING WITH A MEAL   ibuprofen  (ADVIL ) 200 MG tablet 2 tabs by mouth twice daily with meals for 14 days then take every 6 hours only as needed.   No Known Allergies   Review of Systems    Objective:   BP 130/84 (BP Location: Right Arm, Patient Position: Sitting, Cuff Size: Normal)   Pulse 76   Resp 16   Ht 4' 11.25" (1.505 m)   Wt 98 lb (44.5 kg)   SpO2 96%   BMI 19.63 kg/m   Physical Exam NAD Lungs:  CTA CV:  RRR without murmur or rub.  Radial pulses normal and equal Right hand/arm:  unable to elicit Phalens or Tinels over Median nerve at volar wrist.  Assessment & Plan   Right carpal tunnel syndrome:  cock up splint nightly.  To call if no improvement in a month  2.  Left sided symptoms:  neck, arm, elbow, ulnar nerve, CTS:  all resolved   3.  History of hypertension: BP remains fine off med  4.  Hyperlipidemia:  Not quite as good numbers in January--she is working on lifestyle and continuing Atorvastatin .    5.  HM:  needs follow up colonoscopy--referral sent

## 2024-04-14 ENCOUNTER — Encounter: Payer: Self-pay | Admitting: Internal Medicine

## 2024-07-06 ENCOUNTER — Other Ambulatory Visit: Payer: Self-pay | Admitting: Internal Medicine

## 2024-07-06 DIAGNOSIS — E782 Mixed hyperlipidemia: Secondary | ICD-10-CM

## 2024-09-01 ENCOUNTER — Other Ambulatory Visit

## 2024-09-07 ENCOUNTER — Encounter: Admitting: Internal Medicine

## 2025-04-25 ENCOUNTER — Encounter: Admitting: Internal Medicine
